# Patient Record
Sex: Female | Born: 1960 | Race: White | Hispanic: No | Marital: Married | Smoking: Never smoker
Health system: Southern US, Community
[De-identification: ages and names within clinical notes are randomized; demographics above are authoritative.]

## PROBLEM LIST (undated history)

## (undated) DIAGNOSIS — T7840XA Allergy, unspecified, initial encounter: Secondary | ICD-10-CM

## (undated) DIAGNOSIS — R87619 Unspecified abnormal cytological findings in specimens from cervix uteri: Secondary | ICD-10-CM

## (undated) HISTORY — DX: Allergy, unspecified, initial encounter: T78.40XA

## (undated) HISTORY — DX: Unspecified abnormal cytological findings in specimens from cervix uteri: R87.619

---

## 2000-04-20 ENCOUNTER — Ambulatory Visit (HOSPITAL_COMMUNITY): Admission: RE | Admit: 2000-04-20 | Discharge: 2000-04-20 | Payer: Self-pay | Admitting: *Deleted

## 2000-09-12 ENCOUNTER — Other Ambulatory Visit: Admission: RE | Admit: 2000-09-12 | Discharge: 2000-09-12 | Payer: Self-pay | Admitting: *Deleted

## 2000-09-14 ENCOUNTER — Other Ambulatory Visit: Admission: RE | Admit: 2000-09-14 | Discharge: 2000-09-14 | Payer: Self-pay | Admitting: *Deleted

## 2000-11-28 ENCOUNTER — Other Ambulatory Visit: Admission: RE | Admit: 2000-11-28 | Discharge: 2000-11-28 | Payer: Self-pay | Admitting: *Deleted

## 2001-04-03 ENCOUNTER — Other Ambulatory Visit: Admission: RE | Admit: 2001-04-03 | Discharge: 2001-04-03 | Payer: Self-pay | Admitting: *Deleted

## 2001-10-03 ENCOUNTER — Other Ambulatory Visit: Admission: RE | Admit: 2001-10-03 | Discharge: 2001-10-03 | Payer: Self-pay | Admitting: *Deleted

## 2002-10-08 ENCOUNTER — Other Ambulatory Visit: Admission: RE | Admit: 2002-10-08 | Discharge: 2002-10-08 | Payer: Self-pay | Admitting: *Deleted

## 2007-12-02 ENCOUNTER — Ambulatory Visit: Payer: Self-pay | Admitting: General Practice

## 2015-05-13 ENCOUNTER — Telehealth: Payer: Self-pay

## 2015-05-13 ENCOUNTER — Other Ambulatory Visit: Payer: Self-pay | Admitting: Family Medicine

## 2015-05-13 DIAGNOSIS — Z1231 Encounter for screening mammogram for malignant neoplasm of breast: Secondary | ICD-10-CM

## 2015-05-13 NOTE — Telephone Encounter (Signed)
We got letter from [place] stating she needed a mammo. Notified patient and order placed online for mammo thru [facility]. She will call and schedule her appointment.

## 2015-05-26 ENCOUNTER — Ambulatory Visit
Admission: RE | Admit: 2015-05-26 | Discharge: 2015-05-26 | Disposition: A | Discharge status: Home / Self Care | Payer: Managed Care, Other (non HMO) | Source: Ambulatory Visit | Attending: Family Medicine | Admitting: Family Medicine

## 2015-05-26 DIAGNOSIS — Z1231 Encounter for screening mammogram for malignant neoplasm of breast: Secondary | ICD-10-CM | POA: Diagnosis not present

## 2015-07-22 ENCOUNTER — Ambulatory Visit (INDEPENDENT_AMBULATORY_CARE_PROVIDER_SITE_OTHER): Payer: Managed Care, Other (non HMO) | Admitting: Family Medicine

## 2015-07-22 ENCOUNTER — Encounter: Payer: Self-pay | Admitting: Family Medicine

## 2015-07-22 VITALS — BP 128/78 | HR 79 | Temp 98.4°F | Resp 20 | Ht 65.0 in | Wt 178.2 lb

## 2015-07-22 DIAGNOSIS — Z1211 Encounter for screening for malignant neoplasm of colon: Secondary | ICD-10-CM | POA: Diagnosis not present

## 2015-07-22 DIAGNOSIS — Z124 Encounter for screening for malignant neoplasm of cervix: Secondary | ICD-10-CM | POA: Insufficient documentation

## 2015-07-22 DIAGNOSIS — D485 Neoplasm of uncertain behavior of skin: Secondary | ICD-10-CM | POA: Insufficient documentation

## 2015-07-22 DIAGNOSIS — Z0000 Encounter for general adult medical examination without abnormal findings: Secondary | ICD-10-CM | POA: Diagnosis not present

## 2015-07-22 NOTE — Assessment & Plan Note (Signed)
USPSTF grade A and B recommendations reviewed with patient; age-appropriate recommendations, preventive care, screening tests, etc discussed and encouraged; healthy living encouraged; see AVS for patient education given to patient; pt declined hiv and hep C testing

## 2015-07-22 NOTE — Patient Instructions (Addendum)
Please have labs done today We'll have you see a dermatologist to get that mole on your leg evaluated and removed You should receive information about the Cologuard soon If you have not heard anything from my staff in a week about any orders/referrals/studies from today, please contact us here to follow-up [phone]  Health Maintenance, Female Adopting a healthy lifestyle and getting preventive care can go a long way to promote health and wellness. Talk with your health care provider about what schedule of regular examinations is right for you. This is a good chance for you to check in with your provider about disease prevention and staying healthy. In between checkups, there are plenty of things you can do on your own. Experts have done a lot of research about which lifestyle changes and preventive measures are most likely to keep you healthy. Ask your health care provider for more information. WEIGHT AND DIET  Eat a healthy diet  Be sure to include plenty of vegetables, fruits, low-fat dairy products, and lean protein.  Do not eat a lot of foods high in solid fats, added sugars, or salt.  Get regular exercise. This is one of the most important things you can do for your health.  Most adults should exercise for at least 150 minutes each week. The exercise should increase your heart rate and make you sweat (moderate-intensity exercise).  Most adults should also do strengthening exercises at least twice a week. This is in addition to the moderate-intensity exercise.  Maintain a healthy weight  Body mass index (BMI) is a measurement that can be used to identify possible weight problems. It estimates body fat based on height and weight. Your health care provider can help determine your BMI and help you achieve or maintain a healthy weight.  For females 5 years of age and older:   A BMI below 18.5 is considered underweight.  A BMI of 18.5 to 24.9 is normal.  A BMI of 25 to 29.9 is  considered overweight.  A BMI of 30 and above is considered obese.  Watch levels of cholesterol and blood lipids  You should start having your blood tested for lipids and cholesterol at 55 years of age, then have this test every 5 years.  You may need to have your cholesterol levels checked more often if:  Your lipid or cholesterol levels are high.  You are older than 55 years of age.  You are at high risk for heart disease.  CANCER SCREENING   Lung Cancer  Lung cancer screening is recommended for adults 51-74 years old who are at high risk for lung cancer because of a history of smoking.  A yearly low-dose CT scan of the lungs is recommended for people who:  Currently smoke.  Have quit within the past 15 years.  Have at least a 30-pack-year history of smoking. A pack year is smoking an average of one pack of cigarettes a day for 1 year.  Yearly screening should continue until it has been 15 years since you quit.  Yearly screening should stop if you develop a health problem that would prevent you from having lung cancer treatment.  Breast Cancer  Practice breast self-awareness. This means understanding how your breasts normally appear and feel.  It also means doing regular breast self-exams. Let your health care provider know about any changes, no matter how small.  If you are in your 20s or 30s, you should have a clinical breast exam (CBE) by a health  care provider every 1-3 years as part of a regular health exam.  If you are 68 or older, have a CBE every year. Also consider having a breast X-ray (mammogram) every year.  If you have a family history of breast cancer, talk to your health care provider about genetic screening.  If you are at high risk for breast cancer, talk to your health care provider about having an MRI and a mammogram every year.  Breast cancer gene (BRCA) assessment is recommended for women who have family members with BRCA-related cancers.  BRCA-related cancers include:  Breast.  Ovarian.  Tubal.  Peritoneal cancers.  Results of the assessment will determine the need for genetic counseling and BRCA1 and BRCA2 testing. Cervical Cancer Your health care provider may recommend that you be screened regularly for cancer of the pelvic organs (ovaries, uterus, and vagina). This screening involves a pelvic examination, including checking for microscopic changes to the surface of your cervix (Pap test). You may be encouraged to have this screening done every 3 years, beginning at age 80.  For women ages 49-65, health care providers may recommend pelvic exams and Pap testing every 3 years, or they may recommend the Pap and pelvic exam, combined with testing for human papilloma virus (HPV), every 5 years. Some types of HPV increase your risk of cervical cancer. Testing for HPV may also be done on women of any age with unclear Pap test results.  Other health care providers may not recommend any screening for nonpregnant women who are considered low risk for pelvic cancer and who do not have symptoms. Ask your health care provider if a screening pelvic exam is right for you.  If you have had past treatment for cervical cancer or a condition that could lead to cancer, you need Pap tests and screening for cancer for at least 20 years after your treatment. If Pap tests have been discontinued, your risk factors (such as having a new sexual partner) need to be reassessed to determine if screening should resume. Some women have medical problems that increase the chance of getting cervical cancer. In these cases, your health care provider may recommend more frequent screening and Pap tests. Colorectal Cancer  This type of cancer can be detected and often prevented.  Routine colorectal cancer screening usually begins at 55 years of age and continues through 55 years of age.  Your health care provider may recommend screening at an earlier age if you  have risk factors for colon cancer.  Your health care provider may also recommend using home test kits to check for hidden blood in the stool.  A small camera at the end of a tube can be used to examine your colon directly (sigmoidoscopy or colonoscopy). This is done to check for the earliest forms of colorectal cancer.  Routine screening usually begins at age 11.  Direct examination of the colon should be repeated every 5-10 years through 55 years of age. However, you may need to be screened more often if early forms of precancerous polyps or small growths are found. Skin Cancer  Check your skin from head to toe regularly.  Tell your health care provider about any new moles or changes in moles, especially if there is a change in a mole's shape or color.  Also tell your health care provider if you have a mole that is larger than the size of a pencil eraser.  Always use sunscreen. Apply sunscreen liberally and repeatedly throughout the day.  Protect  yourself by wearing long sleeves, pants, a wide-brimmed hat, and sunglasses whenever you are outside. HEART DISEASE, DIABETES, AND HIGH BLOOD PRESSURE   High blood pressure causes heart disease and increases the risk of stroke. High blood pressure is more likely to develop in:  People who have blood pressure in the high end of the normal range (130-139/85-89 mm Hg).  People who are overweight or obese.  People who are African American.  If you are 54-24 years of age, have your blood pressure checked every 3-5 years. If you are 27 years of age or older, have your blood pressure checked every year. You should have your blood pressure measured twice--once when you are at a hospital or clinic, and once when you are not at a hospital or clinic. Record the average of the two measurements. To check your blood pressure when you are not at a hospital or clinic, you can use:  An automated blood pressure machine at a pharmacy.  A home blood pressure  monitor.  If you are between 57 years and 2 years old, ask your health care provider if you should take aspirin to prevent strokes.  Have regular diabetes screenings. This involves taking a blood sample to check your fasting blood sugar level.  If you are at a normal weight and have a low risk for diabetes, have this test once every three years after 55 years of age.  If you are overweight and have a high risk for diabetes, consider being tested at a younger age or more often. PREVENTING INFECTION  Hepatitis B  If you have a higher risk for hepatitis B, you should be screened for this virus. You are considered at high risk for hepatitis B if:  You were born in a country where hepatitis B is common. Ask your health care provider which countries are considered high risk.  Your parents were born in a high-risk country, and you have not been immunized against hepatitis B (hepatitis B vaccine).  You have HIV or AIDS.  You use needles to inject street drugs.  You live with someone who has hepatitis B.  You have had sex with someone who has hepatitis B.  You get hemodialysis treatment.  You take certain medicines for conditions, including cancer, organ transplantation, and autoimmune conditions. Hepatitis C  Blood testing is recommended for:  Everyone born from [birth year] through 1965.  Anyone with known risk factors for hepatitis C. Sexually transmitted infections (STIs)  You should be screened for sexually transmitted infections (STIs) including gonorrhea and chlamydia if:  You are sexually active and are younger than 55 years of age.  You are older than 55 years of age and your health care provider tells you that you are at risk for this type of infection.  Your sexual activity has changed since you were last screened and you are at an increased risk for chlamydia or gonorrhea. Ask your health care provider if you are at risk.  If you do not have HIV, but are at risk, it may be  recommended that you take a prescription medicine daily to prevent HIV infection. This is called pre-exposure prophylaxis (PrEP). You are considered at risk if:  You are sexually active and do not regularly use condoms or know the HIV status of your partner(s).  You take drugs by injection.  You are sexually active with a partner who has HIV. Talk with your health care provider about whether you are at high risk of being infected with  HIV. If you choose to begin PrEP, you should first be tested for HIV. You should then be tested every 3 months for as long as you are taking PrEP.  PREGNANCY   If you are premenopausal and you may become pregnant, ask your health care provider about preconception counseling.  If you may become pregnant, take 400 to 800 micrograms (mcg) of folic acid every day.  If you want to prevent pregnancy, talk to your health care provider about birth control (contraception). OSTEOPOROSIS AND MENOPAUSE   Osteoporosis is a disease in which the bones lose minerals and strength with aging. This can result in serious bone fractures. Your risk for osteoporosis can be identified using a bone density scan.  If you are 72 years of age or older, or if you are at risk for osteoporosis and fractures, ask your health care provider if you should be screened.  Ask your health care provider whether you should take a calcium or vitamin D supplement to lower your risk for osteoporosis.  Menopause may have certain physical symptoms and risks.  Hormone replacement therapy may reduce some of these symptoms and risks. Talk to your health care provider about whether hormone replacement therapy is right for you.  HOME CARE INSTRUCTIONS   Schedule regular health, dental, and eye exams.  Stay current with your immunizations.   Do not use any tobacco products including cigarettes, chewing tobacco, or electronic cigarettes.  If you are pregnant, do not drink alcohol.  If you are  breastfeeding, limit how much and how often you drink alcohol.  Limit alcohol intake to no more than 1 drink per day for nonpregnant women. One drink equals 12 ounces of beer, 5 ounces of wine, or 1 ounces of hard liquor.  Do not use street drugs.  Do not share needles.  Ask your health care provider for help if you need support or information about quitting drugs.  Tell your health care provider if you often feel depressed.  Tell your health care provider if you have ever been abused or do not feel safe at home.   This information is not intended to replace advice given to you by your health care provider. Make sure you discuss any questions you have with your health care provider.   Document Released: 09/05/2010 Document Revised: 03/13/2014 Document Reviewed: 01/22/2013 Elsevier Interactive Patient Education [employer].

## 2015-07-22 NOTE — Assessment & Plan Note (Signed)
She declined olonoscopy but agrees to cologaurd

## 2015-07-22 NOTE — Progress Notes (Signed)
Patient ID: Katelyn Ochoa, female   DOB: October 22, 1960, 55 y.o.   MRN: [id number]   Subjective:   Katelyn Ochoa is a 55 y.o. female here for a complete physical exam  Interim issues since last visit: dealing with daughter's breast cancer  USPSTF grade A and B recommendations Alcohol: no Depression:  Depression screen [facility] 2/9 07/22/2015  Decreased Interest 0  Down, Depressed, Hopeless 0  PHQ - 2 Score 0   Hypertension: no Obesity: no Tobacco use: no  HIV, hep C: politely declined STD testing and prevention (chl/gon/syphilis): politely declined Lipids: check today (FASTING) Glucose: check today (FASTING) Colorectal cancer: denies colonoscopy but willing to get Cologuard Breast cancer: daughter, BRCA neg BRCA gene screening: no ovarian Intimate partner violence: no Cervical cancer screening: today; hx of abnormal, hx of LEEP, all normal since 3+ Vitamin D: takes multivitamins Aspirin: daily 81 mg EC Diet: little better than typical American Exercise: treadmill Skin cancer: dark mole on left shin, just popped up   Past Medical History  Diagnosis Date  . Abnormal Pap smear of cervix maybe 2002-2007    hx of LEEP     History reviewed. No pertinent past surgical history.    Family History  Problem Relation Age of Onset  . Breast cancer Daughter 50  . Cancer Daughter     breast  . Breast cancer Maternal Aunt   . Colon cancer Neg Hx   . Ovarian cancer Neg Hx   . Hyperlipidemia Mother   . Heart disease Father   . COPD Father    Social History  Substance Use Topics  . Smoking status: Never Smoker   . Smokeless tobacco: Not on file  . Alcohol Use: No   Review of Systems  Constitutional: Negative for unexpected weight change.  HENT: Negative for hearing loss.   Eyes: Negative for visual disturbance.  Respiratory: Negative for shortness of breath.   Cardiovascular: Negative for chest pain.  Gastrointestinal: Negative for abdominal pain and blood in stool.   Genitourinary: Negative for hematuria.  Musculoskeletal: Positive for arthralgias (little bit of enlargement right 1st MTP).  Skin:       Dark mole left shin  Allergic/Immunologic: Negative for food allergies.  Neurological: Negative for tremors.  Hematological: Does not bruise/bleed easily.   Objective:   Filed Vitals:   07/22/15 0845  BP: 128/78  Pulse: 79  Temp: 98.4 F (36.9 C)  Resp: 20  Height: 5' 5"  (1.651 m)  Weight: 178 lb 3 oz (80.825 kg)  SpO2: 97%   Body mass index is 29.65 kg/(m^2). Wt Readings from Last 3 Encounters:  07/22/15 178 lb 3 oz (80.825 kg)   Physical Exam  Constitutional: She appears well-developed and well-nourished.  HENT:  Head: Normocephalic and atraumatic.  Eyes: Conjunctivae and EOM are normal. Right eye exhibits no hordeolum. Left eye exhibits no hordeolum. No scleral icterus.  Neck: Carotid bruit is not present. No thyromegaly present.  Cardiovascular: Normal rate, regular rhythm, S1 normal, S2 normal and normal heart sounds.   No extrasystoles are present.  Pulmonary/Chest: Effort normal and breath sounds normal. No respiratory distress. Right breast exhibits no inverted nipple, no mass, no nipple discharge, no skin change and no tenderness. Left breast exhibits no inverted nipple, no mass, no nipple discharge, no skin change and no tenderness. Breasts are symmetrical.  Abdominal: Soft. Normal appearance and bowel sounds are normal. She exhibits no distension, no abdominal bruit, no pulsatile midline mass and no mass. There is no hepatosplenomegaly.  There is no tenderness. No hernia.  Genitourinary: Uterus normal. Pelvic exam was performed with patient prone. There is no rash or lesion on the right labia. There is no rash or lesion on the left labia. Cervix exhibits no motion tenderness. Right adnexum displays no mass, no tenderness and no fullness. Left adnexum displays no mass, no tenderness and no fullness.  Musculoskeletal: Normal range of  motion. She exhibits no edema.  Lymphadenopathy:       Head (right side): No submandibular adenopathy present.       Head (left side): No submandibular adenopathy present.    She has no cervical adenopathy.    She has no axillary adenopathy.  Neurological: She is alert. She displays no tremor. No cranial nerve deficit. She exhibits normal muscle tone. Gait normal.  Skin: Skin is warm and dry. Lesion (2-3 mm very dark brown to blackish anteromedial LEFT shin) noted. No bruising and no ecchymosis noted. No cyanosis. No pallor.  Psychiatric: Her speech is normal and behavior is normal. Thought content normal. Her mood appears not anxious. She does not exhibit a depressed mood.    Assessment/Plan:   Problem List Items Addressed This Visit      Musculoskeletal and Integument   Neoplasm of uncertain behavior of skin of lower leg    Very dark, worrisome; refer to derm      Relevant Orders   Ambulatory referral to Dermatology     Other   Colon cancer screening    She declined olonoscopy but agrees to cologaurd      Relevant Orders   Cologuard   Pap smear for cervical cancer screening    Remote abnormal; thin prep collected today      Relevant Orders   IGP, rfx Aptima HPV ASCU   Preventative health care - Primary    USPSTF grade A and B recommendations reviewed with patient; age-appropriate recommendations, preventive care, screening tests, etc discussed and encouraged; healthy living encouraged; see AVS for patient education given to patient; pt declined hiv and hep C testing      Relevant Orders   CBC with Differential/Platelet   Comprehensive metabolic panel   Lipid Panel w/o Chol/HDL Ratio   TSH      Orders Placed This Encounter  Procedures  . Cologuard  . CBC with Differential/Platelet  . Comprehensive metabolic panel    Order Specific Question:  Has the patient fasted?    Answer:  Yes  . Lipid Panel w/o Chol/HDL Ratio    Order Specific Question:  Has the patient  fasted?    Answer:  Yes  . TSH  . Ambulatory referral to Dermatology    Referral Priority:  Medium    Referral Type:  Consultation    Referral Reason:  Specialty Services Required    Requested Specialty:  Dermatology    Number of Visits Requested:  1   Follow up plan: Return in about 1 year (around 07/21/2016) for complete physical.  An After Visit Summary was printed and given to the patient.

## 2015-07-22 NOTE — Assessment & Plan Note (Signed)
Very dark, worrisome; refer to derm

## 2015-07-22 NOTE — Assessment & Plan Note (Signed)
Remote abnormal; thin prep collected today

## 2015-07-23 LAB — CBC WITH DIFFERENTIAL/PLATELET
Basophils Absolute: 0 10*3/uL (ref 0.0–0.2)
Basos: 1 %
EOS (ABSOLUTE): 0.2 10*3/uL (ref 0.0–0.4)
EOS: 4 %
HEMATOCRIT: 46.6 % (ref 34.0–46.6)
HEMOGLOBIN: 15.1 g/dL (ref 11.1–15.9)
Immature Grans (Abs): 0 10*3/uL (ref 0.0–0.1)
Immature Granulocytes: 0 %
LYMPHS ABS: 1.7 10*3/uL (ref 0.7–3.1)
Lymphs: 31 %
MCH: 30 pg (ref 26.6–33.0)
MCHC: 32.4 g/dL (ref 31.5–35.7)
MCV: 93 fL (ref 79–97)
MONOCYTES: 7 %
Monocytes Absolute: 0.4 10*3/uL (ref 0.1–0.9)
Neutrophils Absolute: 3.1 10*3/uL (ref 1.4–7.0)
Neutrophils: 57 %
Platelets: 149 10*3/uL — ABNORMAL LOW (ref 150–379)
RBC: 5.04 x10E6/uL (ref 3.77–5.28)
RDW: 14 % (ref 12.3–15.4)
WBC: 5.4 10*3/uL (ref 3.4–10.8)

## 2015-07-23 LAB — COMPREHENSIVE METABOLIC PANEL
ALBUMIN: 4.6 g/dL (ref 3.5–5.5)
ALK PHOS: 95 IU/L (ref 39–117)
ALT: 17 IU/L (ref 0–32)
AST: 20 IU/L (ref 0–40)
Albumin/Globulin Ratio: 1.9 (ref 1.2–2.2)
BUN/Creatinine Ratio: 15 (ref 9–23)
BUN: 13 mg/dL (ref 6–24)
Bilirubin Total: 0.5 mg/dL (ref 0.0–1.2)
CALCIUM: 10.4 mg/dL — AB (ref 8.7–10.2)
CHLORIDE: 103 mmol/L (ref 96–106)
CO2: 27 mmol/L (ref 18–29)
Creatinine, Ser: 0.85 mg/dL (ref 0.57–1.00)
GFR calc Af Amer: 89 mL/min/{1.73_m2} (ref >59)
GFR calc non Af Amer: 77 mL/min/{1.73_m2} (ref >59)
GLOBULIN, TOTAL: 2.4 g/dL (ref 1.5–4.5)
Glucose: 96 mg/dL (ref 65–99)
POTASSIUM: 5.1 mmol/L (ref 3.5–5.2)
Sodium: 147 mmol/L — ABNORMAL HIGH (ref 134–144)
TOTAL PROTEIN: 7 g/dL (ref 6.0–8.5)

## 2015-07-23 LAB — LIPID PANEL W/O CHOL/HDL RATIO
CHOLESTEROL TOTAL: 217 mg/dL — AB (ref 100–199)
HDL: 77 mg/dL (ref >39)
LDL Calculated: 123 mg/dL — ABNORMAL HIGH (ref 0–99)
TRIGLYCERIDES: 86 mg/dL (ref 0–149)
VLDL CHOLESTEROL CAL: 17 mg/dL (ref 5–40)

## 2015-07-23 LAB — TSH: TSH: 2.71 u[IU]/mL (ref 0.450–4.500)

## 2015-07-24 ENCOUNTER — Other Ambulatory Visit: Payer: Self-pay | Admitting: Family Medicine

## 2015-07-24 DIAGNOSIS — E870 Hyperosmolality and hypernatremia: Secondary | ICD-10-CM

## 2015-07-24 LAB — IGP, RFX APTIMA HPV ASCU: PAP SMEAR COMMENT: 0

## 2015-07-27 ENCOUNTER — Other Ambulatory Visit: Payer: Self-pay

## 2015-07-27 DIAGNOSIS — E870 Hyperosmolality and hypernatremia: Secondary | ICD-10-CM

## 2015-07-28 LAB — SPECIMEN STATUS REPORT

## 2015-07-28 LAB — HPV, 16/18,45: HPV, high-risk: NEGATIVE

## 2015-07-30 LAB — BASIC METABOLIC PANEL
BUN / CREAT RATIO: 20 (ref 9–23)
BUN: 15 mg/dL (ref 6–24)
CALCIUM: 9.6 mg/dL (ref 8.7–10.2)
CO2: 23 mmol/L (ref 18–29)
Chloride: 100 mmol/L (ref 96–106)
Creatinine, Ser: 0.74 mg/dL (ref 0.57–1.00)
GFR, EST AFRICAN AMERICAN: 105 mL/min/{1.73_m2} (ref >59)
GFR, EST NON AFRICAN AMERICAN: 91 mL/min/{1.73_m2} (ref >59)
Glucose: 93 mg/dL (ref 65–99)
Potassium: 4.4 mmol/L (ref 3.5–5.2)
Sodium: 142 mmol/L (ref 134–144)

## 2016-04-27 ENCOUNTER — Other Ambulatory Visit: Payer: Self-pay | Admitting: Family Medicine

## 2016-04-27 DIAGNOSIS — Z1231 Encounter for screening mammogram for malignant neoplasm of breast: Secondary | ICD-10-CM

## 2016-05-30 ENCOUNTER — Ambulatory Visit
Admission: RE | Admit: 2016-05-30 | Discharge: 2016-05-30 | Disposition: A | Discharge status: Home / Self Care | Payer: Managed Care, Other (non HMO) | Source: Ambulatory Visit | Attending: Family Medicine | Admitting: Family Medicine

## 2016-05-30 DIAGNOSIS — Z1231 Encounter for screening mammogram for malignant neoplasm of breast: Secondary | ICD-10-CM | POA: Insufficient documentation

## 2016-07-25 ENCOUNTER — Encounter: Payer: Self-pay | Admitting: Family Medicine

## 2016-07-25 ENCOUNTER — Ambulatory Visit (INDEPENDENT_AMBULATORY_CARE_PROVIDER_SITE_OTHER): Payer: Managed Care, Other (non HMO) | Admitting: Family Medicine

## 2016-07-25 VITALS — BP 130/72 | HR 84 | Temp 97.9°F | Resp 14 | Ht 64.5 in | Wt 175.8 lb

## 2016-07-25 DIAGNOSIS — Z0000 Encounter for general adult medical examination without abnormal findings: Secondary | ICD-10-CM | POA: Diagnosis not present

## 2016-07-25 DIAGNOSIS — Z1211 Encounter for screening for malignant neoplasm of colon: Secondary | ICD-10-CM | POA: Diagnosis not present

## 2016-07-25 DIAGNOSIS — Z23 Encounter for immunization: Secondary | ICD-10-CM | POA: Diagnosis not present

## 2016-07-25 LAB — CBC WITH DIFFERENTIAL/PLATELET
BASOS ABS: 52 {cells}/uL (ref 0–200)
BASOS PCT: 1 %
Eosinophils Absolute: 260 cells/uL (ref 15–500)
Eosinophils Relative: 5 %
HEMATOCRIT: 47.6 % — AB (ref 35.0–45.0)
Hemoglobin: 15.6 g/dL — ABNORMAL HIGH (ref 11.7–15.5)
Lymphocytes Relative: 31 %
Lymphs Abs: 1612 cells/uL (ref 850–3900)
MCH: 30 pg (ref 27.0–33.0)
MCHC: 32.8 g/dL (ref 32.0–36.0)
MCV: 91.5 fL (ref 80.0–100.0)
MONO ABS: 312 {cells}/uL (ref 200–950)
Monocytes Relative: 6 %
NEUTROS PCT: 57 %
Neutro Abs: 2964 cells/uL (ref 1500–7800)
Platelets: 138 10*3/uL — ABNORMAL LOW (ref 140–400)
RBC: 5.2 MIL/uL — ABNORMAL HIGH (ref 3.80–5.10)
RDW: 13.9 % (ref 11.0–15.0)
WBC: 5.2 10*3/uL (ref 3.8–10.8)

## 2016-07-25 LAB — LIPID PANEL
Cholesterol: 226 mg/dL — ABNORMAL HIGH (ref <200)
HDL: 79 mg/dL (ref >50)
LDL CALC: 130 mg/dL — AB (ref <100)
TRIGLYCERIDES: 83 mg/dL (ref <150)
Total CHOL/HDL Ratio: 2.9 Ratio (ref <5.0)
VLDL: 17 mg/dL (ref <30)

## 2016-07-25 LAB — COMPLETE METABOLIC PANEL WITH GFR
ALBUMIN: 4.2 g/dL (ref 3.6–5.1)
ALK PHOS: 86 U/L (ref 33–130)
ALT: 15 U/L (ref 6–29)
AST: 16 U/L (ref 10–35)
BUN: 17 mg/dL (ref 7–25)
CO2: 23 mmol/L (ref 20–31)
Calcium: 9.8 mg/dL (ref 8.6–10.4)
Chloride: 105 mmol/L (ref 98–110)
Creat: 0.78 mg/dL (ref 0.50–1.05)
GFR, Est African American: >89 mL/min (ref >=60)
GFR, Est Non African American: 85 mL/min (ref >=60)
GLUCOSE: 91 mg/dL (ref 65–99)
POTASSIUM: 4.5 mmol/L (ref 3.5–5.3)
SODIUM: 141 mmol/L (ref 135–146)
Total Bilirubin: 0.5 mg/dL (ref 0.2–1.2)
Total Protein: 6.6 g/dL (ref 6.1–8.1)

## 2016-07-25 NOTE — Progress Notes (Signed)
Patient ID: Katelyn Ochoa, female   DOB: 1960/12/05, 56 y.o.   MRN: [id number]   Subjective:   Katelyn Ochoa is a 56 y.o. female here for a complete physical exam  Interim issues since last visit: nothing exciting  USPSTF grade A and B recommendations Depression:  Depression screen [facility] 2/9 07/25/2016 07/22/2015  Decreased Interest 0 0  Down, Depressed, Hopeless 1 0  PHQ - 2 Score 1 0   Hypertension: BP Readings from Last 3 Encounters:  07/25/16 130/72  07/22/15 128/78   Obesity: Wt Readings from Last 3 Encounters:  07/25/16 175 lb 12.8 oz (79.7 kg)  07/22/15 178 lb 3 oz (80.8 kg)   BMI Readings from Last 3 Encounters:  07/25/16 29.71 kg/m  07/22/15 29.65 kg/m    Alcohol: less than 7 Tobacco use: nonsmoker HIV, hep B, hep C: not interested STD testing and prevention (chl/gon/syphilis): not interested Intimate partner violence: no abuse Breast cancer: no lumps; breast exam done in march  BRCA gene screening: Judson 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, hormone receptor positive, no ovarian cancer Cervical cancer screening: 2020 Osteoporosis: no steroids as a child Fall prevention/vitamin D: discussed, taking multivitamin Lipids:  Lab Results  Component Value Date   CHOL 217 (H) 07/22/2015   Lab Results  Component Value Date   HDL 77 07/22/2015   Lab Results  Component Value Date   LDLCALC 123 (H) 07/22/2015   Lab Results  Component Value Date   TRIG 86 07/22/2015   No results found for: CHOLHDL No results found for: LDLDIRECT Glucose:  Glucose  Date Value Ref Range Status  07/29/2015 93 65 - 99 mg/dL Final  07/22/2015 96 65 - 99 mg/dL Final   Colorectal cancer: no screening done yet; willing to do cologuard Lung cancer:  n/a AAA: n/a Aspirin: baby aspirin Diet: discussed calcium intake; almond milk; broccoli; fiber, will try to increase fruits, veggies Exercise: probably not, does walk Skin cancer: nothing worrisome; dermatologist removed lesion on left lower leg; no  cancer Fell during snow storm and scratched her leg up   Past Medical History:  Diagnosis Date  . Abnormal Pap smear of cervix maybe 2002-2007   hx of LEEP   No past surgical history on file. Family History  Problem Relation Age of Onset  . Hyperlipidemia Mother   . Heart disease Father   . COPD Father   . Breast cancer Daughter 29  . Cancer Daughter        breast  . Breast cancer Maternal Aunt   . Colon cancer Neg Hx   . Ovarian cancer Neg Hx    Social History  Substance Use Topics  . Smoking status: Never Smoker  . Smokeless tobacco: Never Used  . Alcohol use No   Review of Systems  Objective:   Vitals:   07/25/16 0833  BP: 130/72  Pulse: 84  Resp: 14  Temp: 97.9 F (36.6 C)  TempSrc: Oral  SpO2: 96%  Weight: 175 lb 12.8 oz (79.7 kg)  Height: 5' 4.5" (1.638 m)   Body mass index is 29.71 kg/m. Wt Readings from Last 3 Encounters:  07/25/16 175 lb 12.8 oz (79.7 kg)  07/22/15 178 lb 3 oz (80.8 kg)   Physical Exam  Constitutional: She appears well-developed and well-nourished.  HENT:  Head: Normocephalic and atraumatic.  Right Ear: Hearing, tympanic membrane, external ear and ear canal normal.  Left Ear: Hearing, tympanic membrane, external ear and ear canal normal.  Eyes: Conjunctivae and EOM are normal.  Right eye exhibits no hordeolum. Left eye exhibits no hordeolum. No scleral icterus.  Neck: Carotid bruit is not present. No thyromegaly present.  Cardiovascular: Normal rate, regular rhythm, S1 normal, S2 normal and normal heart sounds.   No extrasystoles are present.  Pulmonary/Chest: Effort normal and breath sounds normal. No respiratory distress. Right breast exhibits no inverted nipple, no mass, no nipple discharge, no skin change and no tenderness. Left breast exhibits no inverted nipple, no mass, no nipple discharge, no skin change and no tenderness. Breasts are symmetrical.  Abdominal: Soft. Normal appearance and bowel sounds are normal. She exhibits  no distension, no abdominal bruit, no pulsatile midline mass and no mass. There is no hepatosplenomegaly. There is no tenderness. No hernia.  Musculoskeletal: Normal range of motion. She exhibits no edema.  Lymphadenopathy:       Head (right side): No submandibular adenopathy present.       Head (left side): No submandibular adenopathy present.    She has no cervical adenopathy.    She has no axillary adenopathy.  Neurological: She is alert. She displays no tremor. No cranial nerve deficit. She exhibits normal muscle tone. Gait normal.  Reflex Scores:      Patellar reflexes are 2+ on the right side and 2+ on the left side. Skin: Skin is warm and dry. No bruising and no ecchymosis noted. No cyanosis. No pallor.  Psychiatric: Her speech is normal and behavior is normal. Thought content normal. Her mood appears not anxious. She does not exhibit a depressed mood.   Assessment/Plan:   Problem List Items Addressed This Visit      Other   Preventative health care - Primary    USPSTF grade A and B recommendations reviewed with patient; age-appropriate recommendations, preventive care, screening tests, etc discussed and encouraged; healthy living encouraged; see AVS for patient education given to patient       Relevant Orders   COMPLETE METABOLIC PANEL WITH GFR   CBC with Differential/Platelet   Lipid panel   TSH    Other Visit Diagnoses    Screen for colon cancer       Relevant Orders   Cologuard   Need for Tdap vaccination           Meds ordered this encounter  Medications  . Multiple Vitamin (MULTIVITAMIN) tablet    Sig: Take 1 tablet by mouth daily.  Marland Kitchen aspirin EC 81 MG tablet    Sig: Take 81 mg by mouth daily.  . Vit B6-Vit B12-Omega 3 Acids (VITAMIN B PLUS+ PO)    Sig: Take by mouth.   Orders Placed This Encounter  Procedures  . COMPLETE METABOLIC PANEL WITH GFR  . CBC with Differential/Platelet  . Lipid panel  . TSH  . Cologuard    Screen for colon cancer     Follow up plan: Return in about 1 year (around 07/25/2017) for complete physical.  An After Visit Summary was printed and given to the patient.

## 2016-07-25 NOTE — Assessment & Plan Note (Signed)
USPSTF grade A and B recommendations reviewed with patient; age-appropriate recommendations, preventive care, screening tests, etc discussed and encouraged; healthy living encouraged; see AVS for patient education given to patient  

## 2016-07-25 NOTE — Patient Instructions (Addendum)
Try for 150 minutes of physical activity every week Health Maintenance, Female Adopting a healthy lifestyle and getting preventive care can go a long way to promote health and wellness. Talk with your health care provider about what schedule of regular examinations is right for you. This is a good chance for you to check in with your provider about disease prevention and staying healthy. In between checkups, there are plenty of things you can do on your own. Experts have done a lot of research about which lifestyle changes and preventive measures are most likely to keep you healthy. Ask your health care provider for more information. Weight and diet Eat a healthy diet  Be sure to include plenty of vegetables, fruits, low-fat dairy products, and lean protein.  Do not eat a lot of foods high in solid fats, added sugars, or salt.  Get regular exercise. This is one of the most important things you can do for your health.  Most adults should exercise for at least 150 minutes each week. The exercise should increase your heart rate and make you sweat (moderate-intensity exercise).  Most adults should also do strengthening exercises at least twice a week. This is in addition to the moderate-intensity exercise. Maintain a healthy weight  Body mass index (BMI) is a measurement that can be used to identify possible weight problems. It estimates body fat based on height and weight. Your health care provider can help determine your BMI and help you achieve or maintain a healthy weight.  For females 35 years of age and older:  A BMI below 18.5 is considered underweight.  A BMI of 18.5 to 24.9 is normal.  A BMI of 25 to 29.9 is considered overweight.  A BMI of 30 and above is considered obese. Watch levels of cholesterol and blood lipids  You should start having your blood tested for lipids and cholesterol at 56 years of age, then have this test every 5 years.  You may need to have your cholesterol  levels checked more often if:  Your lipid or cholesterol levels are high.  You are older than 56 years of age.  You are at high risk for heart disease. Cancer screening Lung Cancer  Lung cancer screening is recommended for adults 98-27 years old who are at high risk for lung cancer because of a history of smoking.  A yearly low-dose CT scan of the lungs is recommended for people who:  Currently smoke.  Have quit within the past 15 years.  Have at least a 30-pack-year history of smoking. A pack year is smoking an average of one pack of cigarettes a day for 1 year.  Yearly screening should continue until it has been 15 years since you quit.  Yearly screening should stop if you develop a health problem that would prevent you from having lung cancer treatment. Breast Cancer  Practice breast self-awareness. This means understanding how your breasts normally appear and feel.  It also means doing regular breast self-exams. Let your health care provider know about any changes, no matter how small.  If you are in your 20s or 30s, you should have a clinical breast exam (CBE) by a health care provider every 1-3 years as part of a regular health exam.  If you are 14 or older, have a CBE every year. Also consider having a breast X-ray (mammogram) every year.  If you have a family history of breast cancer, talk to your health care provider about genetic screening.  If  you are at high risk for breast cancer, talk to your health care provider about having an MRI and a mammogram every year.  Breast cancer gene (BRCA) assessment is recommended for women who have family members with BRCA-related cancers. BRCA-related cancers include:  Breast.  Ovarian.  Tubal.  Peritoneal cancers.  Results of the assessment will determine the need for genetic counseling and BRCA1 and BRCA2 testing. Cervical Cancer  Your health care provider may recommend that you be screened regularly for cancer of the  pelvic organs (ovaries, uterus, and vagina). This screening involves a pelvic examination, including checking for microscopic changes to the surface of your cervix (Pap test). You may be encouraged to have this screening done every 3 years, beginning at age 23.  For women ages 13-65, health care providers may recommend pelvic exams and Pap testing every 3 years, or they may recommend the Pap and pelvic exam, combined with testing for human papilloma virus (HPV), every 5 years. Some types of HPV increase your risk of cervical cancer. Testing for HPV may also be done on women of any age with unclear Pap test results.  Other health care providers may not recommend any screening for nonpregnant women who are considered low risk for pelvic cancer and who do not have symptoms. Ask your health care provider if a screening pelvic exam is right for you.  If you have had past treatment for cervical cancer or a condition that could lead to cancer, you need Pap tests and screening for cancer for at least 20 years after your treatment. If Pap tests have been discontinued, your risk factors (such as having a new sexual partner) need to be reassessed to determine if screening should resume. Some women have medical problems that increase the chance of getting cervical cancer. In these cases, your health care provider may recommend more frequent screening and Pap tests. Colorectal Cancer  This type of cancer can be detected and often prevented.  Routine colorectal cancer screening usually begins at 56 years of age and continues through 56 years of age.  Your health care provider may recommend screening at an earlier age if you have risk factors for colon cancer.  Your health care provider may also recommend using home test kits to check for hidden blood in the stool.  A small camera at the end of a tube can be used to examine your colon directly (sigmoidoscopy or colonoscopy). This is done to check for the earliest  forms of colorectal cancer.  Routine screening usually begins at age 74.  Direct examination of the colon should be repeated every 5-10 years through 56 years of age. However, you may need to be screened more often if early forms of precancerous polyps or small growths are found. Skin Cancer  Check your skin from head to toe regularly.  Tell your health care provider about any new moles or changes in moles, especially if there is a change in a mole's shape or color.  Also tell your health care provider if you have a mole that is larger than the size of a pencil eraser.  Always use sunscreen. Apply sunscreen liberally and repeatedly throughout the day.  Protect yourself by wearing long sleeves, pants, a wide-brimmed hat, and sunglasses whenever you are outside. Heart disease, diabetes, and high blood pressure  High blood pressure causes heart disease and increases the risk of stroke. High blood pressure is more likely to develop in:  People who have blood pressure in the high  end of the normal range (130-139/85-89 mm Hg).  People who are overweight or obese.  People who are African American.  If you are 46-81 years of age, have your blood pressure checked every 3-5 years. If you are 35 years of age or older, have your blood pressure checked every year. You should have your blood pressure measured twice-once when you are at a hospital or clinic, and once when you are not at a hospital or clinic. Record the average of the two measurements. To check your blood pressure when you are not at a hospital or clinic, you can use:  An automated blood pressure machine at a pharmacy.  A home blood pressure monitor.  If you are between 33 years and 58 years old, ask your health care provider if you should take aspirin to prevent strokes.  Have regular diabetes screenings. This involves taking a blood sample to check your fasting blood sugar level.  If you are at a normal weight and have a low  risk for diabetes, have this test once every three years after 56 years of age.  If you are overweight and have a high risk for diabetes, consider being tested at a younger age or more often. Preventing infection Hepatitis B  If you have a higher risk for hepatitis B, you should be screened for this virus. You are considered at high risk for hepatitis B if:  You were born in a country where hepatitis B is common. Ask your health care provider which countries are considered high risk.  Your parents were born in a high-risk country, and you have not been immunized against hepatitis B (hepatitis B vaccine).  You have HIV or AIDS.  You use needles to inject street drugs.  You live with someone who has hepatitis B.  You have had sex with someone who has hepatitis B.  You get hemodialysis treatment.  You take certain medicines for conditions, including cancer, organ transplantation, and autoimmune conditions. Hepatitis C  Blood testing is recommended for:  Everyone born from [birth year] through 1965.  Anyone with known risk factors for hepatitis C. Sexually transmitted infections (STIs)  You should be screened for sexually transmitted infections (STIs) including gonorrhea and chlamydia if:  You are sexually active and are younger than 56 years of age.  You are older than 56 years of age and your health care provider tells you that you are at risk for this type of infection.  Your sexual activity has changed since you were last screened and you are at an increased risk for chlamydia or gonorrhea. Ask your health care provider if you are at risk.  If you do not have HIV, but are at risk, it may be recommended that you take a prescription medicine daily to prevent HIV infection. This is called pre-exposure prophylaxis (PrEP). You are considered at risk if:  You are sexually active and do not regularly use condoms or know the HIV status of your partner(s).  You take drugs by  injection.  You are sexually active with a partner who has HIV. Talk with your health care provider about whether you are at high risk of being infected with HIV. If you choose to begin PrEP, you should first be tested for HIV. You should then be tested every 3 months for as long as you are taking PrEP. Pregnancy  If you are premenopausal and you may become pregnant, ask your health care provider about preconception counseling.  If you may become pregnant,  take 400 to 800 micrograms (mcg) of folic acid every day.  If you want to prevent pregnancy, talk to your health care provider about birth control (contraception). Osteoporosis and menopause  Osteoporosis is a disease in which the bones lose minerals and strength with aging. This can result in serious bone fractures. Your risk for osteoporosis can be identified using a bone density scan.  If you are 45 years of age or older, or if you are at risk for osteoporosis and fractures, ask your health care provider if you should be screened.  Ask your health care provider whether you should take a calcium or vitamin D supplement to lower your risk for osteoporosis.  Menopause may have certain physical symptoms and risks.  Hormone replacement therapy may reduce some of these symptoms and risks. Talk to your health care provider about whether hormone replacement therapy is right for you. Follow these instructions at home:  Schedule regular health, dental, and eye exams.  Stay current with your immunizations.  Do not use any tobacco products including cigarettes, chewing tobacco, or electronic cigarettes.  If you are pregnant, do not drink alcohol.  If you are breastfeeding, limit how much and how often you drink alcohol.  Limit alcohol intake to no more than 1 drink per day for nonpregnant women. One drink equals 12 ounces of beer, 5 ounces of wine, or 1 ounces of hard liquor.  Do not use street drugs.  Do not share needles.  Ask  your health care provider for help if you need support or information about quitting drugs.  Tell your health care provider if you often feel depressed.  Tell your health care provider if you have ever been abused or do not feel safe at home. This information is not intended to replace advice given to you by your health care provider. Make sure you discuss any questions you have with your health care provider. Document Released: 09/05/2010 Document Revised: 07/29/2015 Document Reviewed: 11/24/2014 Elsevier Interactive Patient Education  2017 [employer].

## 2016-07-26 ENCOUNTER — Telehealth: Payer: Self-pay

## 2016-07-26 ENCOUNTER — Other Ambulatory Visit: Payer: Self-pay | Admitting: Family Medicine

## 2016-07-26 DIAGNOSIS — D582 Other hemoglobinopathies: Secondary | ICD-10-CM

## 2016-07-26 LAB — TSH: TSH: 2.73 mIU/L

## 2016-07-26 NOTE — Telephone Encounter (Signed)
Pt is concern about her lab results.

## 2016-07-26 NOTE — Assessment & Plan Note (Signed)
Recheck CBC hydrated

## 2016-07-26 NOTE — Telephone Encounter (Signed)
I had not released them yet though MyChart (?) I called patient Discussed labs Recheck CBC in 1-2 weeks May be hemoconcentration, may be sleep apnea; she denies snoring Will just recheck I'm not overly worried about platelet count

## 2016-07-26 NOTE — Progress Notes (Signed)
Recheck CBC in a few weeks; if still elevated, consider sleep study

## 2016-11-02 ENCOUNTER — Telehealth: Payer: Self-pay | Admitting: Family Medicine

## 2016-11-02 NOTE — Telephone Encounter (Signed)
Left detailed vociemail

## 2016-11-02 NOTE — Telephone Encounter (Signed)
Please follow-up with patient about the outstanding Cologuard order We strongly encourage colon cancer screening, as colon cancer is the 3rd leading cause of death from cancer in both men and women, and early detection saves lives Thank you 

## 2016-11-02 NOTE — Telephone Encounter (Signed)
Patient was supposed to return for recheck CBC in late May; please ask her to have that done soon, NON-fasting, come well-hydrated Thank you High H/H can be a sign of sleep apnea, so if still elevated, she should be referred to Dr. Ashby Dawes or other sleep specialist for evaluation Thank you

## 2016-11-03 NOTE — Telephone Encounter (Signed)
Patient notified

## 2017-06-08 ENCOUNTER — Other Ambulatory Visit: Payer: Self-pay | Admitting: Family Medicine

## 2017-07-27 ENCOUNTER — Encounter: Payer: Self-pay | Admitting: Family Medicine

## 2017-07-27 ENCOUNTER — Ambulatory Visit (INDEPENDENT_AMBULATORY_CARE_PROVIDER_SITE_OTHER): Payer: Managed Care, Other (non HMO) | Admitting: Family Medicine

## 2017-07-27 ENCOUNTER — Other Ambulatory Visit: Payer: Self-pay | Admitting: Family Medicine

## 2017-07-27 VITALS — BP 136/82 | HR 96 | Temp 98.1°F | Resp 12 | Ht 64.5 in | Wt 165.6 lb

## 2017-07-27 DIAGNOSIS — D582 Other hemoglobinopathies: Secondary | ICD-10-CM

## 2017-07-27 DIAGNOSIS — Z1231 Encounter for screening mammogram for malignant neoplasm of breast: Secondary | ICD-10-CM | POA: Diagnosis not present

## 2017-07-27 DIAGNOSIS — Z0000 Encounter for general adult medical examination without abnormal findings: Secondary | ICD-10-CM

## 2017-07-27 DIAGNOSIS — Z1239 Encounter for other screening for malignant neoplasm of breast: Secondary | ICD-10-CM

## 2017-07-27 DIAGNOSIS — Z1211 Encounter for screening for malignant neoplasm of colon: Secondary | ICD-10-CM

## 2017-07-27 DIAGNOSIS — D696 Thrombocytopenia, unspecified: Secondary | ICD-10-CM

## 2017-07-27 NOTE — Progress Notes (Signed)
Elevated RBC; refer to pulm Mildly low PLT; recheck in 3 months; consider spleen US if persistent/dropping

## 2017-07-27 NOTE — Progress Notes (Signed)
Patient ID: Katelyn Ochoa, female   DOB: 01/08/1961, 57 y.o.   MRN: [id number]   Subjective:   Katelyn Ochoa is a 57 y.o. female here for a complete physical exam  Interim issues since last visit: no flu, no medical excitement  USPSTF grade A and B recommendations Depression:  Depression screen [facility] 2/9 07/27/2017 07/25/2016 07/22/2015  Decreased Interest 0 0 0  Down, Depressed, Hopeless 0 1 0  PHQ - 2 Score 0 1 0   Hypertension: BP Readings from Last 3 Encounters:  07/27/17 136/82  07/25/16 130/72  07/22/15 128/78   Obesity: Wt Readings from Last 3 Encounters:  07/27/17 165 lb 9.6 oz (75.1 kg)  07/25/16 175 lb 12.8 oz (79.7 kg)  07/22/15 178 lb 3 oz (80.8 kg)   BMI Readings from Last 3 Encounters:  07/27/17 27.99 kg/m  07/25/16 29.71 kg/m  07/22/15 29.65 kg/m     Skin cancer: no worrisome moles Lung cancer:  nonsmoker Breast cancer: mammogram ordered; no lumps Colorectal cancer: sent in the [facility], requesting results Cervical cancer screening: UTD BRCA gene screening: family hx of breast and/or ovarian cancer and/or metastatic prostate cancer? 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 Alcohol: occasionally Tobacco use: nonsmoker AAA: n/a Aspirin:  Glucose:  Glucose  Date Value Ref Range Status  07/29/2015 93 65 - 99 mg/dL Final  07/22/2015 96 65 - 99 mg/dL Final   Glucose, Bld  Date Value Ref Range Status  07/25/2016 91 65 - 99 mg/dL Final   Lipids:   Lab Results  Component Value Date   CHOL 226 (H) 07/25/2016   CHOL 217 (H) 07/22/2015   Lab Results  Component Value Date   HDL 79 07/25/2016   HDL 77 07/22/2015   Lab Results  Component Value Date   LDLCALC 130 (H) 07/25/2016   LDLCALC 123 (H) 07/22/2015   Lab Results  Component Value Date   TRIG 83 07/25/2016   TRIG 86 07/22/2015   Lab Results  Component Value Date   CHOLHDL 2.9 07/25/2016   No results found for: LDLDIRECT   Past Medical History:  Diagnosis Date  . Abnormal Pap smear of cervix maybe 2002-2007   hx of LEEP   History reviewed. No pertinent surgical history. Family History  Problem Relation Age of Onset  . Hyperlipidemia Mother   . Heart disease Father   . COPD Father   . Breast cancer Daughter 19  . Cancer Daughter        breast  . Breast cancer Maternal Aunt   . Colon cancer Neg Hx   . Ovarian cancer Neg Hx    Social History   Tobacco Use  . Smoking status: Never Smoker  . Smokeless tobacco: Never Used  Substance Use Topics  . Alcohol use: Yes    Comment: occasional  . Drug use: No   Review of Systems  Objective:   Vitals:   07/27/17 0823  BP: 136/82  Pulse: 96  Resp: 12  Temp: 98.1 F (36.7 C)  TempSrc: Oral  SpO2:  96%  Weight: 165 lb 9.6 oz (75.1 kg)  Height: 5' 4.5" (1.638 m)   Body mass index is 27.99 kg/m. Wt Readings from Last 3 Encounters:  07/27/17 165 lb 9.6 oz (75.1 kg)  07/25/16 175 lb 12.8 oz (79.7 kg)  07/22/15 178 lb 3 oz (80.8 kg)   Physical Exam  Constitutional: She appears well-developed and well-nourished.  HENT:  Head: Normocephalic and atraumatic.  Right Ear: Hearing, tympanic membrane, external ear and ear canal normal.  Left Ear: Hearing, tympanic membrane, external ear and ear canal normal.  Eyes: Conjunctivae and EOM are normal. Right eye exhibits no hordeolum. Left eye exhibits no hordeolum. No scleral icterus.  Neck: Carotid bruit is not present. No thyromegaly present.  Cardiovascular:  Normal rate, regular rhythm, S1 normal, S2 normal and normal heart sounds.  No extrasystoles are present.  Pulmonary/Chest: Effort normal and breath sounds normal. No respiratory distress. Right breast exhibits no inverted nipple, no mass, no nipple discharge, no skin change and no tenderness. Left breast exhibits no inverted nipple, no mass, no nipple discharge, no skin change and no tenderness. Breasts are symmetrical.  Abdominal: Soft. Normal appearance and bowel sounds are normal. She exhibits no distension, no abdominal bruit, no pulsatile midline mass and no mass. There is no hepatosplenomegaly. There is no tenderness. No hernia.  Musculoskeletal: Normal range of motion. She exhibits no edema.  Lymphadenopathy:       Head (right side): No submandibular adenopathy present.       Head (left side): No submandibular adenopathy present.    She has no cervical adenopathy.    She has no axillary adenopathy.  Neurological: She is alert. She displays no tremor. No cranial nerve deficit. She exhibits normal muscle tone. Gait normal.  Reflex Scores:      Patellar reflexes are 2+ on the right side and 2+ on the left side. Skin: Skin is warm and dry. No bruising and no ecchymosis noted. No cyanosis. No pallor.  Psychiatric: Her speech is normal and behavior is normal. Thought content normal. Her mood appears not anxious. She does not exhibit a depressed mood.    Assessment/Plan:   Problem List Items Addressed This Visit      Other   Preventative health care - Primary    USPSTF grade A and B recommendations reviewed with patient; age-appropriate recommendations, preventive care, screening tests, etc discussed and encouraged; healthy living encouraged; see AVS for patient education given to patient Calculated her 10 year ASCVD risk, aspirin guide used; she does not meet criteria for aspirin use; reviewed with patient      Relevant Orders   CBC with Differential/Platelet   Lipid panel   TSH    Comprehensive metabolic panel   Colon cancer screening    Staff member called [facility]; they never received her package; she was not charged; patient informed; she is going to repeat the test       Other Visit Diagnoses    Encounter for screening breast examination       Relevant Orders   MM Digital Screening   Screen for colon cancer       Relevant Orders   [facility]       No orders of the defined types were placed in this encounter.  Orders Placed This Encounter  Procedures  . MM Digital Screening    Standing Status:   Future    Standing Expiration Date:   09/27/2018    Order Specific Question:   Reason for  Exam (SYMPTOM  OR DIAGNOSIS REQUIRED)    Answer:   screen for breast exam    Order Specific Question:   Is the patient pregnant?    Answer:   No    Order Specific Question:   Preferred imaging location?    Answer:   [facility]  . [facility]  . CBC with Differential/Platelet  . Lipid panel  . TSH  . Comprehensive metabolic panel    Follow up plan: Return in about 1 year (around 07/28/2018) for complete physical.  An After Visit Summary was printed and given to the patient.

## 2017-07-27 NOTE — Assessment & Plan Note (Addendum)
USPSTF grade A and B recommendations reviewed with patient; age-appropriate recommendations, preventive care, screening tests, etc discussed and encouraged; healthy living encouraged; see AVS for patient education given to patient Calculated her 10 year ASCVD risk, aspirin guide used; she does not meet criteria for aspirin use; reviewed with patient

## 2017-07-27 NOTE — Patient Instructions (Addendum)
Consider getting the new shingles vaccine called Shingrix; that is available for individuals 57 years of age and older, and is recommended even if you have had shingles in the past and/or already received the old shingles vaccine (Zostavax); it is a two-part series, and is available at many local pharmacies  Let's get labs If you have not heard anything from my staff in a week about any orders/referrals/studies from today, please contact us here to follow-up [phone]  Health Maintenance, Female Adopting a healthy lifestyle and getting preventive care can go a long way to promote health and wellness. Talk with your health care provider about what schedule of regular examinations is right for you. This is a good chance for you to check in with your provider about disease prevention and staying healthy. In between checkups, there are plenty of things you can do on your own. Experts have done a lot of research about which lifestyle changes and preventive measures are most likely to keep you healthy. Ask your health care provider for more information. Weight and diet Eat a healthy diet  Be sure to include plenty of vegetables, fruits, low-fat dairy products, and lean protein.  Do not eat a lot of foods high in solid fats, added sugars, or salt.  Get regular exercise. This is one of the most important things you can do for your health. ? Most adults should exercise for at least 150 minutes each week. The exercise should increase your heart rate and make you sweat (moderate-intensity exercise). ? Most adults should also do strengthening exercises at least twice a week. This is in addition to the moderate-intensity exercise.  Maintain a healthy weight  Body mass index (BMI) is a measurement that can be used to identify possible weight problems. It estimates body fat based on height and weight. Your health care provider can help determine your BMI and help you achieve or maintain a healthy  weight.  For females 50 years of age and older: ? A BMI below 18.5 is considered underweight. ? A BMI of 18.5 to 24.9 is normal. ? A BMI of 25 to 29.9 is considered overweight. ? A BMI of 30 and above is considered obese.  Watch levels of cholesterol and blood lipids  You should start having your blood tested for lipids and cholesterol at 57 years of age, then have this test every 5 years.  You may need to have your cholesterol levels checked more often if: ? Your lipid or cholesterol levels are high. ? You are older than 57 years of age. ? You are at high risk for heart disease.  Cancer screening Lung Cancer  Lung cancer screening is recommended for adults 79-9 years old who are at high risk for lung cancer because of a history of smoking.  A yearly low-dose CT scan of the lungs is recommended for people who: ? Currently smoke. ? Have quit within the past 15 years. ? Have at least a 30-pack-year history of smoking. A pack year is smoking an average of one pack of cigarettes a day for 1 year.  Yearly screening should continue until it has been 15 years since you quit.  Yearly screening should stop if you develop a health problem that would prevent you from having lung cancer treatment.  Breast Cancer  Practice breast self-awareness. This means understanding how your breasts normally appear and feel.  It also means doing regular breast self-exams. Let your health care provider know about any changes, no matter  how small.  If you are in your 20s or 30s, you should have a clinical breast exam (CBE) by a health care provider every 1-3 years as part of a regular health exam.  If you are 74 or older, have a CBE every year. Also consider having a breast X-ray (mammogram) every year.  If you have a family history of breast cancer, talk to your health care provider about genetic screening.  If you are at high risk for breast cancer, talk to your health care provider about having an  MRI and a mammogram every year.  Breast cancer gene (BRCA) assessment is recommended for women who have family members with BRCA-related cancers. BRCA-related cancers include: ? Breast. ? Ovarian. ? Tubal. ? Peritoneal cancers.  Results of the assessment will determine the need for genetic counseling and BRCA1 and BRCA2 testing.  Cervical Cancer Your health care provider may recommend that you be screened regularly for cancer of the pelvic organs (ovaries, uterus, and vagina). This screening involves a pelvic examination, including checking for microscopic changes to the surface of your cervix (Pap test). You may be encouraged to have this screening done every 3 years, beginning at age 58.  For women ages 49-65, health care providers may recommend pelvic exams and Pap testing every 3 years, or they may recommend the Pap and pelvic exam, combined with testing for human papilloma virus (HPV), every 5 years. Some types of HPV increase your risk of cervical cancer. Testing for HPV may also be done on women of any age with unclear Pap test results.  Other health care providers may not recommend any screening for nonpregnant women who are considered low risk for pelvic cancer and who do not have symptoms. Ask your health care provider if a screening pelvic exam is right for you.  If you have had past treatment for cervical cancer or a condition that could lead to cancer, you need Pap tests and screening for cancer for at least 20 years after your treatment. If Pap tests have been discontinued, your risk factors (such as having a new sexual partner) need to be reassessed to determine if screening should resume. Some women have medical problems that increase the chance of getting cervical cancer. In these cases, your health care provider may recommend more frequent screening and Pap tests.  Colorectal Cancer  This type of cancer can be detected and often prevented.  Routine colorectal cancer screening  usually begins at 57 years of age and continues through 57 years of age.  Your health care provider may recommend screening at an earlier age if you have risk factors for colon cancer.  Your health care provider may also recommend using home test kits to check for hidden blood in the stool.  A small camera at the end of a tube can be used to examine your colon directly (sigmoidoscopy or colonoscopy). This is done to check for the earliest forms of colorectal cancer.  Routine screening usually begins at age 7.  Direct examination of the colon should be repeated every 5-10 years through 57 years of age. However, you may need to be screened more often if early forms of precancerous polyps or small growths are found.  Skin Cancer  Check your skin from head to toe regularly.  Tell your health care provider about any new moles or changes in moles, especially if there is a change in a mole's shape or color.  Also tell your health care provider if you have a  mole that is larger than the size of a pencil eraser.  Always use sunscreen. Apply sunscreen liberally and repeatedly throughout the day.  Protect yourself by wearing long sleeves, pants, a wide-brimmed hat, and sunglasses whenever you are outside.  Heart disease, diabetes, and high blood pressure  High blood pressure causes heart disease and increases the risk of stroke. High blood pressure is more likely to develop in: ? People who have blood pressure in the high end of the normal range (130-139/85-89 mm Hg). ? People who are overweight or obese. ? People who are African American.  If you are 46-65 years of age, have your blood pressure checked every 3-5 years. If you are 28 years of age or older, have your blood pressure checked every year. You should have your blood pressure measured twice-once when you are at a hospital or clinic, and once when you are not at a hospital or clinic. Record the average of the two measurements. To check  your blood pressure when you are not at a hospital or clinic, you can use: ? An automated blood pressure machine at a pharmacy. ? A home blood pressure monitor.  If you are between 54 years and 60 years old, ask your health care provider if you should take aspirin to prevent strokes.  Have regular diabetes screenings. This involves taking a blood sample to check your fasting blood sugar level. ? If you are at a normal weight and have a low risk for diabetes, have this test once every three years after 57 years of age. ? If you are overweight and have a high risk for diabetes, consider being tested at a younger age or more often. Preventing infection Hepatitis B  If you have a higher risk for hepatitis B, you should be screened for this virus. You are considered at high risk for hepatitis B if: ? You were born in a country where hepatitis B is common. Ask your health care provider which countries are considered high risk. ? Your parents were born in a high-risk country, and you have not been immunized against hepatitis B (hepatitis B vaccine). ? You have HIV or AIDS. ? You use needles to inject street drugs. ? You live with someone who has hepatitis B. ? You have had sex with someone who has hepatitis B. ? You get hemodialysis treatment. ? You take certain medicines for conditions, including cancer, organ transplantation, and autoimmune conditions.  Hepatitis C  Blood testing is recommended for: ? Everyone born from [birth year] through 1965. ? Anyone with known risk factors for hepatitis C.  Sexually transmitted infections (STIs)  You should be screened for sexually transmitted infections (STIs) including gonorrhea and chlamydia if: ? You are sexually active and are younger than 57 years of age. ? You are older than 57 years of age and your health care provider tells you that you are at risk for this type of infection. ? Your sexual activity has changed since you were last screened and you  are at an increased risk for chlamydia or gonorrhea. Ask your health care provider if you are at risk.  If you do not have HIV, but are at risk, it may be recommended that you take a prescription medicine daily to prevent HIV infection. This is called pre-exposure prophylaxis (PrEP). You are considered at risk if: ? You are sexually active and do not regularly use condoms or know the HIV status of your partner(s). ? You take drugs by injection. ? You  are sexually active with a partner who has HIV.  Talk with your health care provider about whether you are at high risk of being infected with HIV. If you choose to begin PrEP, you should first be tested for HIV. You should then be tested every 3 months for as long as you are taking PrEP. Pregnancy  If you are premenopausal and you may become pregnant, ask your health care provider about preconception counseling.  If you may become pregnant, take 400 to 800 micrograms (mcg) of folic acid every day.  If you want to prevent pregnancy, talk to your health care provider about birth control (contraception). Osteoporosis and menopause  Osteoporosis is a disease in which the bones lose minerals and strength with aging. This can result in serious bone fractures. Your risk for osteoporosis can be identified using a bone density scan.  If you are 26 years of age or older, or if you are at risk for osteoporosis and fractures, ask your health care provider if you should be screened.  Ask your health care provider whether you should take a calcium or vitamin D supplement to lower your risk for osteoporosis.  Menopause may have certain physical symptoms and risks.  Hormone replacement therapy may reduce some of these symptoms and risks. Talk to your health care provider about whether hormone replacement therapy is right for you. Follow these instructions at home:  Schedule regular health, dental, and eye exams.  Stay current with your  immunizations.  Do not use any tobacco products including cigarettes, chewing tobacco, or electronic cigarettes.  If you are pregnant, do not drink alcohol.  If you are breastfeeding, limit how much and how often you drink alcohol.  Limit alcohol intake to no more than 1 drink per day for nonpregnant women. One drink equals 12 ounces of beer, 5 ounces of wine, or 1 ounces of hard liquor.  Do not use street drugs.  Do not share needles.  Ask your health care provider for help if you need support or information about quitting drugs.  Tell your health care provider if you often feel depressed.  Tell your health care provider if you have ever been abused or do not feel safe at home. This information is not intended to replace advice given to you by your health care provider. Make sure you discuss any questions you have with your health care provider. Document Released: 09/05/2010 Document Revised: 07/29/2015 Document Reviewed: 11/24/2014 Elsevier Interactive Patient Education  [employer].

## 2017-07-27 NOTE — Assessment & Plan Note (Signed)
Staff member called [facility]; they never received her package; she was not charged; patient informed; she is going to repeat the test

## 2017-07-28 LAB — COMPREHENSIVE METABOLIC PANEL
ALBUMIN: 5.1 g/dL (ref 3.5–5.5)
ALT: 19 IU/L (ref 0–32)
AST: 20 IU/L (ref 0–40)
Albumin/Globulin Ratio: 2.1 (ref 1.2–2.2)
Alkaline Phosphatase: 103 IU/L (ref 39–117)
BUN / CREAT RATIO: 19 (ref 9–23)
BUN: 13 mg/dL (ref 6–24)
Bilirubin Total: 0.4 mg/dL (ref 0.0–1.2)
CALCIUM: 10.3 mg/dL — AB (ref 8.7–10.2)
CO2: 22 mmol/L (ref 20–29)
CREATININE: 0.7 mg/dL (ref 0.57–1.00)
Chloride: 104 mmol/L (ref 96–106)
GFR calc Af Amer: 111 mL/min/{1.73_m2} (ref >59)
GFR, EST NON AFRICAN AMERICAN: 96 mL/min/{1.73_m2} (ref >59)
GLOBULIN, TOTAL: 2.4 g/dL (ref 1.5–4.5)
Glucose: 84 mg/dL (ref 65–99)
Potassium: 3.8 mmol/L (ref 3.5–5.2)
SODIUM: 143 mmol/L (ref 134–144)
TOTAL PROTEIN: 7.5 g/dL (ref 6.0–8.5)

## 2017-07-28 LAB — CBC WITH DIFFERENTIAL/PLATELET
BASOS: 1 %
Basophils Absolute: 0 10*3/uL (ref 0.0–0.2)
EOS (ABSOLUTE): 0.2 10*3/uL (ref 0.0–0.4)
EOS: 4 %
Hematocrit: 48.8 % — ABNORMAL HIGH (ref 34.0–46.6)
Hemoglobin: 15.9 g/dL (ref 11.1–15.9)
IMMATURE GRANS (ABS): 0 10*3/uL (ref 0.0–0.1)
IMMATURE GRANULOCYTES: 0 %
LYMPHS: 32 %
Lymphocytes Absolute: 1.8 10*3/uL (ref 0.7–3.1)
MCH: 30.1 pg (ref 26.6–33.0)
MCHC: 32.6 g/dL (ref 31.5–35.7)
MCV: 92 fL (ref 79–97)
Monocytes Absolute: 0.5 10*3/uL (ref 0.1–0.9)
Monocytes: 8 %
NEUTROS PCT: 55 %
Neutrophils Absolute: 3.3 10*3/uL (ref 1.4–7.0)
Platelets: 138 10*3/uL — ABNORMAL LOW (ref 150–450)
RBC: 5.29 x10E6/uL — ABNORMAL HIGH (ref 3.77–5.28)
RDW: 13.6 % (ref 12.3–15.4)
WBC: 5.8 10*3/uL (ref 3.4–10.8)

## 2017-07-28 LAB — TSH: TSH: 3.53 u[IU]/mL (ref 0.450–4.500)

## 2017-07-28 LAB — LIPID PANEL
CHOL/HDL RATIO: 2.8 ratio (ref 0.0–4.4)
Cholesterol, Total: 230 mg/dL — ABNORMAL HIGH (ref 100–199)
HDL: 81 mg/dL (ref >39)
LDL Calculated: 135 mg/dL — ABNORMAL HIGH (ref 0–99)
Triglycerides: 72 mg/dL (ref 0–149)
VLDL CHOLESTEROL CAL: 14 mg/dL (ref 5–40)

## 2017-08-10 ENCOUNTER — Other Ambulatory Visit: Payer: Self-pay

## 2017-08-10 ENCOUNTER — Telehealth: Payer: Self-pay

## 2017-08-10 DIAGNOSIS — Z139 Encounter for screening, unspecified: Secondary | ICD-10-CM

## 2017-08-10 DIAGNOSIS — Z0000 Encounter for general adult medical examination without abnormal findings: Secondary | ICD-10-CM

## 2017-08-10 NOTE — Telephone Encounter (Signed)
Copied from [place] [id number]. Topic: General - Other >> Aug 10, 2017 10:19 AM Valla Leaver wrote: Reason for CRM: Patient will be faxing a form for Dr. Sanda Klein to the office today. Its a form she forgot to leave it after her appointment on 05/24. It's to document her CPE for work.

## 2017-08-10 NOTE — Telephone Encounter (Signed)
Thank you :)

## 2017-08-17 LAB — COLOGUARD: COLOGUARD: NEGATIVE

## 2017-08-22 ENCOUNTER — Other Ambulatory Visit: Payer: Self-pay | Admitting: Family Medicine

## 2017-08-22 ENCOUNTER — Ambulatory Visit
Admission: RE | Admit: 2017-08-22 | Discharge: 2017-08-22 | Disposition: A | Discharge status: Home / Self Care | Payer: Managed Care, Other (non HMO) | Source: Ambulatory Visit | Attending: Family Medicine | Admitting: Family Medicine

## 2017-08-22 DIAGNOSIS — Z1231 Encounter for screening mammogram for malignant neoplasm of breast: Secondary | ICD-10-CM | POA: Insufficient documentation

## 2017-08-22 DIAGNOSIS — Z1239 Encounter for other screening for malignant neoplasm of breast: Secondary | ICD-10-CM

## 2017-08-23 ENCOUNTER — Encounter: Payer: Self-pay | Admitting: Family Medicine

## 2017-08-23 ENCOUNTER — Other Ambulatory Visit: Payer: Self-pay | Admitting: Family Medicine

## 2017-08-23 DIAGNOSIS — N6310 Unspecified lump in the right breast, unspecified quadrant: Secondary | ICD-10-CM

## 2017-08-23 DIAGNOSIS — R928 Other abnormal and inconclusive findings on diagnostic imaging of breast: Secondary | ICD-10-CM

## 2017-08-24 ENCOUNTER — Encounter: Payer: Self-pay | Admitting: Family Medicine

## 2017-08-24 LAB — MEASLES/MUMPS/RUBELLA IMMUNITY
MUMPS ABS, IGG: 82.8 AU/mL (ref >10.9)
RUBEOLA AB, IGG: <25 AU/mL — ABNORMAL LOW (ref >29.9)
Rubella Antibodies, IGG: 1.07 index (ref >0.99)

## 2017-08-24 LAB — HEMOGLOBIN A1C
ESTIMATED AVERAGE GLUCOSE: 108 mg/dL
Hgb A1c MFr Bld: 5.4 % (ref 4.8–5.6)

## 2017-08-27 ENCOUNTER — Ambulatory Visit (INDEPENDENT_AMBULATORY_CARE_PROVIDER_SITE_OTHER): Payer: Managed Care, Other (non HMO)

## 2017-08-27 DIAGNOSIS — Z23 Encounter for immunization: Secondary | ICD-10-CM | POA: Diagnosis not present

## 2017-08-27 NOTE — Telephone Encounter (Signed)
Form filled out; ready for her

## 2017-08-28 ENCOUNTER — Encounter: Payer: Self-pay | Admitting: Family Medicine

## 2017-09-03 ENCOUNTER — Ambulatory Visit
Admission: RE | Admit: 2017-09-03 | Discharge: 2017-09-03 | Disposition: A | Discharge status: Home / Self Care | Payer: Managed Care, Other (non HMO) | Source: Ambulatory Visit | Attending: Family Medicine | Admitting: Family Medicine

## 2017-09-03 DIAGNOSIS — R928 Other abnormal and inconclusive findings on diagnostic imaging of breast: Secondary | ICD-10-CM | POA: Insufficient documentation

## 2017-09-03 DIAGNOSIS — N6310 Unspecified lump in the right breast, unspecified quadrant: Secondary | ICD-10-CM | POA: Insufficient documentation

## 2018-07-22 ENCOUNTER — Other Ambulatory Visit: Payer: Self-pay | Admitting: Family Medicine

## 2018-07-22 DIAGNOSIS — Z1231 Encounter for screening mammogram for malignant neoplasm of breast: Secondary | ICD-10-CM

## 2018-07-30 ENCOUNTER — Ambulatory Visit (INDEPENDENT_AMBULATORY_CARE_PROVIDER_SITE_OTHER): Payer: Managed Care, Other (non HMO) | Admitting: Nurse Practitioner

## 2018-07-30 ENCOUNTER — Other Ambulatory Visit: Payer: Self-pay

## 2018-07-30 ENCOUNTER — Encounter: Payer: Self-pay | Admitting: Nurse Practitioner

## 2018-07-30 VITALS — BP 126/80 | HR 93 | Temp 98.2°F | Resp 14 | Ht 64.0 in | Wt 172.3 lb

## 2018-07-30 DIAGNOSIS — Z0000 Encounter for general adult medical examination without abnormal findings: Secondary | ICD-10-CM

## 2018-07-30 DIAGNOSIS — Z124 Encounter for screening for malignant neoplasm of cervix: Secondary | ICD-10-CM | POA: Diagnosis not present

## 2018-07-30 DIAGNOSIS — E785 Hyperlipidemia, unspecified: Secondary | ICD-10-CM

## 2018-07-30 DIAGNOSIS — Z131 Encounter for screening for diabetes mellitus: Secondary | ICD-10-CM

## 2018-07-30 DIAGNOSIS — R718 Other abnormality of red blood cells: Secondary | ICD-10-CM

## 2018-07-30 NOTE — Progress Notes (Signed)
Name: Katelyn Ochoa   MRN: [id number]    DOB: [birth year]   Date:07/30/2018       Progress Note  Subjective  Chief Complaint  Chief Complaint  Patient presents with  . Annual Exam    HPI   Patient presents for annual CPE.  Diet:  Patient states recently has been eating anything and everything. Tried a relaxed keto diet for a bit.  Breakfast- slimfast shake 10-am nuts or fruits Lunch- salad, frittata,  Afternoon snack- fruits or nuts, m&ms  Supper- grilled porkchops, greenbeans Has 1-2 servings a fruit and vegetables a day.  Drink: water - all day.  Exercise:  Walks on treadmill from 30-60 minutes a few times a week; goes up and down steps at work.   USPSTF grade A and B recommendations    Office Visit from 07/30/2018 in [facility]  AUDIT-C Score  1     Depression: Phq 9 is  negative Depression screen [facility] 2/9 07/30/2018 07/27/2017 07/25/2016 07/22/2015  Decreased Interest 0 0 0 0  Down, Depressed, Hopeless 0 0 1 0  PHQ - 2 Score 0 0 1 0  Altered sleeping 0 - - -  Tired, decreased energy 0 - - -  Change in appetite 0 - - -  Feeling bad or failure about yourself  0 - - -  Trouble concentrating 0 - - -  Moving slowly or fidgety/restless 0 - - -  Suicidal thoughts 0 - - -  PHQ-9 Score 0 - - -  Difficult doing work/chores Not difficult at all - - -   Hypertension: BP Readings from Last 3 Encounters:  07/30/18 126/80  07/27/17 136/82  07/25/16 130/72   Obesity: Wt Readings from Last 3 Encounters:  07/30/18 172 lb 4.8 oz (78.2 kg)  07/27/17 165 lb 9.6 oz (75.1 kg)  07/25/16 175 lb 12.8 oz (79.7 kg)   BMI Readings from Last 3 Encounters:  07/30/18 29.58 kg/m  07/27/17 27.99 kg/m  07/25/16 29.71 kg/m    Hep C Screening:  STD testing and prevention (HIV/chl/gon/syphilis): declines  Intimate partner violence: denies  Sexual History/Pain during Intercourse: sexually active, no pain Menstrual History/LMP/Abnormal Bleeding: post-menopausal,  denies bleeding Incontinence Symptoms: denies   Advanced Care Planning: A voluntary discussion about advance care planning including the explanation and discussion of advance directives.  Discussed health care proxy and Living will, and the patient was able to identify a health care proxy as husband Shaylynn Nulty.  Patient does not have a living will at present time. If patient does have living will, I have requested they bring this to the clinic to be scanned in to their chart.  Breast cancer: daughter had breast cancer with double mastectomy, last year mammogram showed No evidence of malignancy. Benign intramammary lymph node within the outer RIGHT breast. Diagnostic mammogram confirming benign appearance.   Cervical cancer screening: due today   Osteoporosis Screening:  No results found for: HMDEXASCAN  Lipids:  Lab Results  Component Value Date   CHOL 230 (H) 07/27/2017   CHOL 226 (H) 07/25/2016   CHOL 217 (H) 07/22/2015   Lab Results  Component Value Date   HDL 81 07/27/2017   HDL 79 07/25/2016   HDL 77 07/22/2015   Lab Results  Component Value Date   LDLCALC 135 (H) 07/27/2017   LDLCALC 130 (H) 07/25/2016   LDLCALC 123 (H) 07/22/2015   Lab Results  Component Value Date   TRIG 72 07/27/2017   TRIG 83  07/25/2016   TRIG 86 07/22/2015   Lab Results  Component Value Date   CHOLHDL 2.8 07/27/2017   CHOLHDL 2.9 07/25/2016   No results found for: LDLDIRECT  Glucose:  Glucose  Date Value Ref Range Status  07/27/2017 84 65 - 99 mg/dL Final  07/29/2015 93 65 - 99 mg/dL Final  07/22/2015 96 65 - 99 mg/dL Final   Glucose, Bld  Date Value Ref Range Status  07/25/2016 91 65 - 99 mg/dL Final   Results of the Epworth flowsheet 07/30/2018  Sitting and reading 0  Watching TV 2  Sitting, inactive in a public place (e.g. a theatre or a meeting) 0  As a passenger in a car for an hour without a break 0  Lying down to rest in the afternoon when circumstances permit 3   Sitting and talking to someone 0  Sitting quietly after a lunch without alcohol 0  In a car, while stopped for a few minutes in traffic 0  Total score 5     Skin cancer: uses sunscreen, no history of skin cancer.  Colorectal cancer: cologuard due 2022  Lung cancer:  Low Dose CT Chest recommended if Age 49-80 years, 30 pack-year currently smoking OR have quit w/in 15years. Patient does not qualify.     Patient Active Problem List   Diagnosis Date Noted  . Elevated hemoglobin ([place]) 07/26/2016  . Colon cancer screening 07/22/2015  . Preventative health care 07/22/2015  . Neoplasm of uncertain behavior of skin of lower leg 07/22/2015  . Pap smear for cervical cancer screening 07/22/2015    History reviewed. No pertinent surgical history.  Family History  Problem Relation Age of Onset  . Hyperlipidemia Mother   . Heart disease Father   . COPD Father   . Breast cancer Daughter 67  . Cancer Daughter        breast  . Breast cancer Maternal Aunt   . Colon cancer Neg Hx   . Ovarian cancer Neg Hx     Social History   Socioeconomic History  . Marital status: Married    Spouse name: Patrick Jupiter  . Number of children: 3  . Years of education: 30  . Highest education level: High school graduate  Occupational History  . Not on file  Social Needs  . Financial resource strain: Not hard at all  . Food insecurity:    Worry: Never true    Inability: Never true  . Transportation needs:    Medical: No    Non-medical: No  Tobacco Use  . Smoking status: Never Smoker  . Smokeless tobacco: Never Used  Substance and Sexual Activity  . Alcohol use: Yes    Comment: occasional  . Drug use: No  . Sexual activity: Yes    Birth control/protection: Condom  Lifestyle  . Physical activity:    Days per week: 5 days    Minutes per session: 30 min  . Stress: Not at all  Relationships  . Social connections:    Talks on phone: More than three times a week    Gets together: Twice a week     Attends religious service: 1 to 4 times per year    Active member of club or organization: Yes    Attends meetings of clubs or organizations: Never    Relationship status: Married  . Intimate partner violence:    Fear of current or ex partner: No    Emotionally abused: No    Physically abused: No  Forced sexual activity: No  Other Topics Concern  . Not on file  Social History Narrative  . Not on file     Current Outpatient Medications:  Marland Kitchen  Multiple Vitamin (MULTIVITAMIN) tablet, Take 1 tablet by mouth daily., Disp: , Rfl:  .  Vit B6-Vit B12-Omega 3 Acids (VITAMIN B PLUS+ PO), Take 1 tablet by mouth. , Disp: , Rfl:   Allergies  Allergen Reactions  . Penicillins Rash     Review of Systems  Constitutional: Negative for chills, fever and malaise/fatigue.  HENT: Negative for congestion, sinus pain and sore throat.   Eyes: Negative for blurred vision (wears glasess) and double vision.  Respiratory: Negative for cough and shortness of breath.   Cardiovascular: Negative for chest pain, palpitations and leg swelling.  Gastrointestinal: Negative for abdominal pain, blood in stool, constipation, diarrhea and nausea.  Genitourinary: Negative for dysuria.  Musculoskeletal: Negative for falls and joint pain.  Skin: Negative for rash.  Neurological: Negative for dizziness and headaches.  Endo/Heme/Allergies: Negative for polydipsia.  Psychiatric/Behavioral: The patient is not nervous/anxious and does not have insomnia.      Objective  Vitals:   07/30/18 1523  BP: 126/80  Pulse: 93  Resp: 14  Temp: 98.2 F (36.8 C)  TempSrc: Oral  SpO2: 95%  Weight: 172 lb 4.8 oz (78.2 kg)  Height: 5\' 4"  (1.626 m)    Body mass index is 29.58 kg/m.  Physical Exam  Constitutional: Patient appears well-developed and well-nourished. No distress.  HENT: Head: Normocephalic and atraumatic. Ears: B TMs ok, no erythema or effusion;  Eyes: Conjunctivae and EOM are normal. Pupils are equal,  round, and reactive to light. No scleral icterus.  Neck: Normal range of motion. Neck supple. No JVD present. No thyromegaly present.  Cardiovascular: Normal rate, regular rhythm and normal heart sounds.  No murmur heard. No BLE edema. Pulmonary/Chest: Effort normal and breath sounds normal. No respiratory distress. Abdominal: Soft. Bowel sounds are normal, no distension. There is no tenderness. no masses Breast: no lumps or masses, no nipple discharge or rashes FEMALE GENITALIA:  External genitalia normal External urethra normal Vaginal vault normal without discharge or lesions Cervix normal without discharge or lesions Bimanual exam normal without masses Musculoskeletal: Normal range of motion, no joint effusions. No gross deformities Neurological: he is alert and oriented to person, place, and time. No cranial nerve deficit. Coordination, balance, strength, speech and gait are normal.  Skin: Skin is warm and dry. No rash noted. No erythema.  Psychiatric: Patient has a normal mood and affect. behavior is normal. Judgment and thought content normal.     Fall Risk: Fall Risk  07/30/2018 07/27/2017 07/25/2016 07/22/2015  Falls in the past year? 0 No Yes No  Number falls in past yr: - - 1 -  Injury with Fall? - - No -     Functional Status Survey: Is the patient deaf or have difficulty hearing?: No Does the patient have difficulty seeing, even when wearing glasses/contacts?: No Does the patient have difficulty concentrating, remembering, or making decisions?: No Does the patient have difficulty walking or climbing stairs?: No Does the patient have difficulty dressing or bathing?: No Does the patient have difficulty doing errands alone such as visiting a doctor's office or shopping?: No   Assessment & Plan  1. Routine general medical examination at a health care facility - CBC w/Diff/Platelet - Comprehensive Metabolic Panel (CMET) - Hepatitis C Antibody  2. Encounter for  Papanicolaou smear for cervical cancer screening - Cytology -  PAP - Pap IG w/ reflex to HPV when ASC-U  3. Elevated lipids Discussed ways to lower lipids - Lipid Profile  4. Screening for diabetes mellitus - Comprehensive Metabolic Panel (CMET)  5. Elevated hematocrit Declined sleep study.  - CBC w/Diff/Platelet  -USPSTF grade A and B recommendations reviewed with patient; age-appropriate recommendations, preventive care, screening tests, etc discussed and encouraged; healthy living encouraged; see AVS for patient education given to patient -Discussed importance of 150 minutes of physical activity weekly, eat two servings of fish weekly, eat one serving of tree nuts ( cashews, pistachios, pecans, almonds.Marland Kitchen) every other day, eat 6 servings of fruit/vegetables daily and drink plenty of water and avoid sweet beverages.

## 2018-07-30 NOTE — Patient Instructions (Addendum)
General recommendations: 150 minutes of physical activity weekly, eat two servings of fish weekly, eat one serving of tree nuts ( cashews, pistachios, pecans, almonds.Marland Kitchen) every other day, eat 6 servings of fruit/vegetables daily and drink plenty of water and avoid sweet beverages.   Please do call to schedule your mammogram; the number to schedule one at either [facility] or [facility] is [phone]  Bad cholesterol, also called low-density lipoprotein (LDL), carries cholesterol and other fats that your liver makes to your body tissue. If it builds up in blood vessels, LDL can cause heart disease and other health problems. Your LDL level should be below 100. If you have diabetes or a possible heart problem, your LDL should be below 70.  Eat: Eat 20 to 30 grams of soluble fiber every day. Foods such as fruits and vegetables, whole grains, beans, peas, nuts, and seeds can help lower LDL. Avoid: Saturated fats (Dairy foods - such as butter, cream, ghee, regular-fat milk and cheese. Meat - such as fatty cuts of beef, pork and lamb, processed meats like salami, sausages and the skin on chicken. Lard., fatty snack foods, cakes, biscuits, pies and deep fried foods)

## 2018-08-01 ENCOUNTER — Other Ambulatory Visit: Payer: Self-pay | Admitting: Nurse Practitioner

## 2018-08-01 DIAGNOSIS — Z1231 Encounter for screening mammogram for malignant neoplasm of breast: Secondary | ICD-10-CM

## 2018-08-09 LAB — PAP IG W/ RFLX HPV ASCU

## 2018-08-26 ENCOUNTER — Ambulatory Visit
Admission: RE | Admit: 2018-08-26 | Discharge: 2018-08-26 | Disposition: A | Discharge status: Home / Self Care | Payer: Managed Care, Other (non HMO) | Source: Ambulatory Visit | Attending: Nurse Practitioner | Admitting: Nurse Practitioner

## 2018-08-26 ENCOUNTER — Other Ambulatory Visit: Payer: Self-pay

## 2018-08-26 DIAGNOSIS — Z1231 Encounter for screening mammogram for malignant neoplasm of breast: Secondary | ICD-10-CM

## 2018-08-27 ENCOUNTER — Other Ambulatory Visit: Payer: Self-pay | Admitting: Nurse Practitioner

## 2018-08-27 DIAGNOSIS — D696 Thrombocytopenia, unspecified: Secondary | ICD-10-CM

## 2018-08-27 LAB — COMPREHENSIVE METABOLIC PANEL
ALT: 18 IU/L (ref 0–32)
AST: 18 IU/L (ref 0–40)
Albumin/Globulin Ratio: 2.1 (ref 1.2–2.2)
Albumin: 4.8 g/dL (ref 3.8–4.9)
Alkaline Phosphatase: 112 IU/L (ref 39–117)
BUN/Creatinine Ratio: 21 (ref 9–23)
BUN: 15 mg/dL (ref 6–24)
Bilirubin Total: 0.4 mg/dL (ref 0.0–1.2)
CO2: 24 mmol/L (ref 20–29)
Calcium: 9.9 mg/dL (ref 8.7–10.2)
Chloride: 102 mmol/L (ref 96–106)
Creatinine, Ser: 0.73 mg/dL (ref 0.57–1.00)
GFR calc Af Amer: 105 mL/min/{1.73_m2} (ref >59)
GFR calc non Af Amer: 91 mL/min/{1.73_m2} (ref >59)
Globulin, Total: 2.3 g/dL (ref 1.5–4.5)
Glucose: 87 mg/dL (ref 65–99)
Potassium: 4.6 mmol/L (ref 3.5–5.2)
Sodium: 142 mmol/L (ref 134–144)
Total Protein: 7.1 g/dL (ref 6.0–8.5)

## 2018-08-27 LAB — CBC WITH DIFFERENTIAL/PLATELET
Basophils Absolute: 0.1 10*3/uL (ref 0.0–0.2)
Basos: 1 %
EOS (ABSOLUTE): 0.5 10*3/uL — ABNORMAL HIGH (ref 0.0–0.4)
Eos: 8 %
Hematocrit: 48.7 % — ABNORMAL HIGH (ref 34.0–46.6)
Hemoglobin: 16 g/dL — ABNORMAL HIGH (ref 11.1–15.9)
Immature Grans (Abs): 0 10*3/uL (ref 0.0–0.1)
Immature Granulocytes: 0 %
Lymphocytes Absolute: 2 10*3/uL (ref 0.7–3.1)
Lymphs: 33 %
MCH: 29.7 pg (ref 26.6–33.0)
MCHC: 32.9 g/dL (ref 31.5–35.7)
MCV: 91 fL (ref 79–97)
Monocytes Absolute: 0.5 10*3/uL (ref 0.1–0.9)
Monocytes: 8 %
Neutrophils Absolute: 3 10*3/uL (ref 1.4–7.0)
Neutrophils: 50 %
Platelets: 136 10*3/uL — ABNORMAL LOW (ref 150–450)
RBC: 5.38 x10E6/uL — ABNORMAL HIGH (ref 3.77–5.28)
RDW: 12.6 % (ref 11.7–15.4)
WBC: 6 10*3/uL (ref 3.4–10.8)

## 2018-08-27 LAB — HEPATITIS C ANTIBODY: Hep C Virus Ab: <0.1 s/co ratio (ref 0.0–0.9)

## 2018-08-27 LAB — LIPID PANEL
Chol/HDL Ratio: 2.9 ratio (ref 0.0–4.4)
Cholesterol, Total: 241 mg/dL — ABNORMAL HIGH (ref 100–199)
HDL: 84 mg/dL (ref >39)
LDL Calculated: 142 mg/dL — ABNORMAL HIGH (ref 0–99)
Triglycerides: 73 mg/dL (ref 0–149)
VLDL Cholesterol Cal: 15 mg/dL (ref 5–40)

## 2018-08-30 ENCOUNTER — Other Ambulatory Visit: Payer: Self-pay

## 2018-09-02 ENCOUNTER — Inpatient Hospital Stay: Payer: Managed Care, Other (non HMO) | Attending: Internal Medicine | Admitting: Internal Medicine

## 2018-09-02 ENCOUNTER — Encounter: Payer: Self-pay | Admitting: Internal Medicine

## 2018-09-02 ENCOUNTER — Other Ambulatory Visit: Payer: Self-pay

## 2018-09-02 DIAGNOSIS — D696 Thrombocytopenia, unspecified: Secondary | ICD-10-CM | POA: Insufficient documentation

## 2018-09-02 NOTE — Progress Notes (Signed)
[place] NOTE  Patient Care Team: Fredderick Severance, NP as PCP - General (Nurse Practitioner)  CHIEF COMPLAINTS/PURPOSE OF CONSULTATION: Thrombocytopenia   HEMATOLOGY HISTORY  # THROMBOCYTOPENIA [platelets- ]; WBC- ; Hb-   HISTORY OF PRESENTING ILLNESS:  Katelyn Ochoa 58 y.o.  female pleasant patient was been referred to [place] for further evaluation of thrombocytopenia.  Patient denies any easy bruising or bleeding.  Denies any abdominal pain weight loss or night sweats or early satiety.  No swelling in the legs.  Patient states that she was told to have low platelets when she was pregnant 26 years ago; evaluated by Dr. Inez Pilgrim.  At that time she was offered splenectomy; blood counts improved of its own.  Pregnancy/postpartum was uncomplicated.  She admits to occasional alcohol during the weekends.  Otherwise no new medications.  Patient is not on any medications.  Denies any fatigue denies any headaches.   Review of Systems  Constitutional: Negative for chills, diaphoresis, fever, malaise/fatigue and weight loss.  HENT: Negative for nosebleeds and sore throat.   Eyes: Negative for double vision.  Respiratory: Negative for cough, hemoptysis, sputum production, shortness of breath and wheezing.   Cardiovascular: Negative for chest pain, palpitations, orthopnea and leg swelling.  Gastrointestinal: Negative for abdominal pain, blood in stool, constipation, diarrhea, heartburn, melena, nausea and vomiting.  Genitourinary: Negative for dysuria, frequency and urgency.  Musculoskeletal: Negative for back pain and joint pain.  Skin: Negative.  Negative for itching and rash.  Neurological: Negative for dizziness, tingling, focal weakness, weakness and headaches.  Endo/Heme/Allergies: Does not bruise/bleed easily.  Psychiatric/Behavioral: Negative for depression. The patient is not nervous/anxious and does not have insomnia.      MEDICAL HISTORY:  Past Medical  History:  Diagnosis Date  . Abnormal Pap smear of cervix maybe 2002-2007   hx of LEEP    SURGICAL HISTORY: History reviewed. No pertinent surgical history.  SOCIAL HISTORY: Social History   Socioeconomic History  . Marital status: Married    Spouse name: Patrick Jupiter  . Number of children: 3  . Years of education: 29  . Highest education level: High school graduate  Occupational History  . Not on file  Social Needs  . Financial resource strain: Not hard at all  . Food insecurity    Worry: Never true    Inability: Never true  . Transportation needs    Medical: No    Non-medical: No  Tobacco Use  . Smoking status: Never Smoker  . Smokeless tobacco: Never Used  Substance and Sexual Activity  . Alcohol use: Yes    Comment: occasional  . Drug use: No  . Sexual activity: Yes    Birth control/protection: Condom  Lifestyle  . Physical activity    Days per week: 5 days    Minutes per session: 30 min  . Stress: Not at all  Relationships  . Social connections    Talks on phone: More than three times a week    Gets together: Twice a week    Attends religious service: 1 to 4 times per year    Active member of club or organization: Yes    Attends meetings of clubs or organizations: Never    Relationship status: Married  . Intimate partner violence    Fear of current or ex partner: No    Emotionally abused: No    Physically abused: No    Forced sexual activity: No  Other Topics Concern  . Not on file  Social History Narrative   occassional alcohol/over week ends; never smoked; in [place]; in [facility].     FAMILY HISTORY: Family History  Problem Relation Age of Onset  . Hyperlipidemia Mother   . Heart disease Father   . COPD Father   . Breast cancer Daughter 31  . Cancer Daughter        breast  . Breast cancer Maternal Aunt   . Colon cancer Neg Hx   . Ovarian cancer Neg Hx     ALLERGIES:  is allergic to penicillins.  MEDICATIONS:  Current Outpatient Medications   Medication Sig Dispense Refill  . Multiple Vitamin (MULTIVITAMIN) tablet Take 1 tablet by mouth daily.    . Vit B6-Vit B12-Omega 3 Acids (VITAMIN B PLUS+ PO) Take 1 tablet by mouth.      No current facility-administered medications for this visit.      Marland Kitchen  PHYSICAL EXAMINATION:   Vitals:   09/02/18 1116 09/02/18 1118  BP: (!) 160/101 (!) 159/101  Pulse: 89 89  Temp: 98.7 F (37.1 C)    Filed Weights   09/02/18 1116  Weight: 174 lb 3.2 oz (79 kg)    Physical Exam  Constitutional: She is oriented to person, place, and time and well-developed, well-nourished, and in no distress.  HENT:  Head: Normocephalic and atraumatic.  Mouth/Throat: Oropharynx is clear and moist. No oropharyngeal exudate.  Eyes: Pupils are equal, round, and reactive to light.  Neck: Normal range of motion. Neck supple.  Cardiovascular: Normal rate and regular rhythm.  Pulmonary/Chest: No respiratory distress. She has no wheezes.  Abdominal: Soft. Bowel sounds are normal. She exhibits no distension and no mass. There is no abdominal tenderness. There is no rebound and no guarding.  Musculoskeletal: Normal range of motion.        General: No tenderness or edema.  Neurological: She is alert and oriented to person, place, and time.  Skin: Skin is warm.  Psychiatric: Affect normal.     LABORATORY DATA:  I have reviewed the data as listed Lab Results  Component Value Date   WBC 6.0 08/26/2018   HGB 16.0 (H) 08/26/2018   HCT 48.7 (H) 08/26/2018   MCV 91 08/26/2018   PLT 136 (L) 08/26/2018   Recent Labs    08/26/18 1327  NA 142  K 4.6  CL 102  CO2 24  GLUCOSE 87  BUN 15  CREATININE 0.73  CALCIUM 9.9  GFRNONAA 91  GFRAA 105  PROT 7.1  ALBUMIN 4.8  AST 18  ALT 18  ALKPHOS 112  BILITOT 0.4     Mm 3d Screen Breast Bilateral  Result Date: 08/26/2018 CLINICAL DATA:  Screening. EXAM: DIGITAL SCREENING BILATERAL MAMMOGRAM WITH TOMO AND CAD COMPARISON:  Previous exam(s). ACR Breast Density  Category b: There are scattered areas of fibroglandular density. FINDINGS: There are no findings suspicious for malignancy. Images were processed with CAD. IMPRESSION: No mammographic evidence of malignancy. A result letter of this screening mammogram will be mailed directly to the patient. RECOMMENDATION: Screening mammogram in one year. (Code:SM-B-01Y) BI-RADS CATEGORY  1: Negative. Electronically Signed   By: Marin Olp M.D.   On: 08/26/2018 16:54    ASSESSMENT & PLAN:   Thrombocytopenia (HCC) #Chronic mild thrombocytopenia platelets are 130s-unclear etiology ITP versus other causes.  Patient is clinically asymptomatic; no obvious splenomegaly/liver disease on exam.  Recommend surveillance at this time.  Patient feels comfortable holding off any further work-up at this time.   # Slightly elevated hb/HCT-etiology is  unclear second versus primary.  Again patient is asymptomatic.;  Recommend holding off at this time however if continues to get worse would recommend Jak 2 mutation/erythropoietin levels etc.   Thank you Ms.Poulose, NP for allowing me to participate in the care of your pleasant patient. Please do not hesitate to contact me with questions or concerns in the interim.  # DISPOSITION: # No labs today # follow up in 6 months- MD/cbc/cmp/LDH- Dr.B  All questions were answered. The patient knows to call the clinic with any problems, questions or concerns.  Thank you Dr. for allowing me to participate in the care of your pleasant patient. Please do not hesitate to contact me with questions or concerns in the interim.   Cammie Sickle, MD 09/02/2018 12:12 PM   # 45 minutes face-to-face with the patient discussing the above plan of care; more than 50% of time spent on counseling and coordination.

## 2018-09-02 NOTE — Assessment & Plan Note (Addendum)
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  Patient is clinically asymptomatic; no obvious splenomegaly/liver disease on exam.  Recommend surveillance at this time.  Patient feels comfortable holding off any further work-up at this time.    Educated patient at length regarding potential signs and symptoms of thrombocytopenia/bleeding; call us sooner if she is symptomatic.  # Slightly elevated hb/HCT-etiology is unclear second versus primary.  Again patient is asymptomatic.;  Recommend holding off at this time however if continues to get worse would recommend Jak 2 mutation/erythropoietin levels etc.   Thank you Ms.Poulose, NP for allowing me to participate in the care of your pleasant patient. Please do not hesitate to contact me with questions or concerns in the interim.  # DISPOSITION: # No labs today # follow up in 6 months- MD/cbc/cmp/LDH- Dr.B

## 2019-03-04 ENCOUNTER — Ambulatory Visit: Payer: Managed Care, Other (non HMO) | Admitting: Internal Medicine

## 2019-03-04 ENCOUNTER — Other Ambulatory Visit: Payer: Managed Care, Other (non HMO)

## 2019-06-30 IMAGING — MG DIGITAL SCREENING BILATERAL MAMMOGRAM WITH TOMO AND CAD
8 series · 8 of 24 positions shown · non-contrast
Comparison: Previous exam(s).

CLINICAL DATA: Screening.

EXAM:
DIGITAL SCREENING BILATERAL MAMMOGRAM WITH TOMO AND CAD

[R MLO synth-2D]
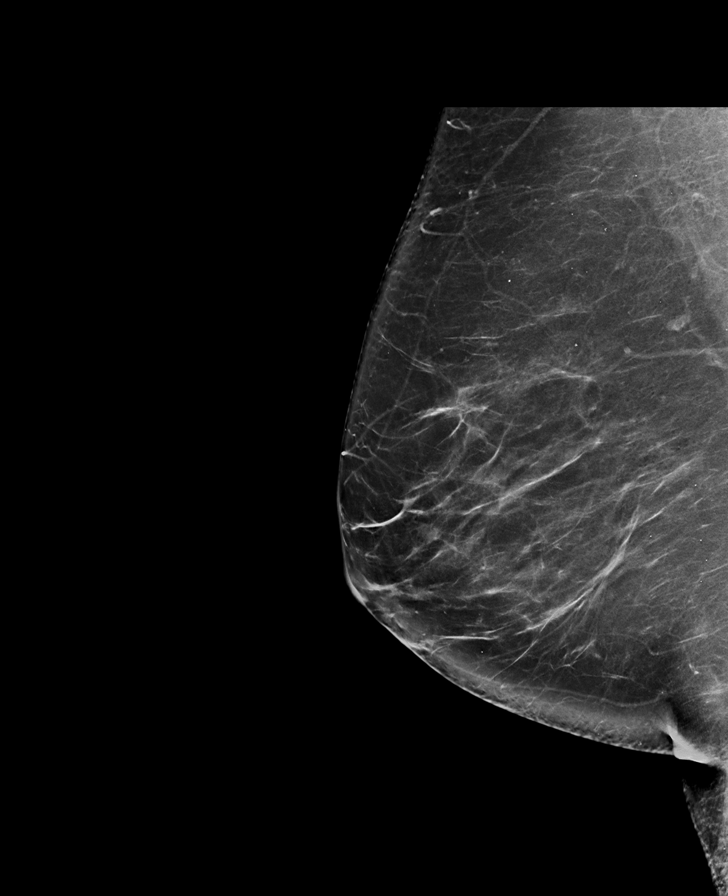

[L CC synth-2D]
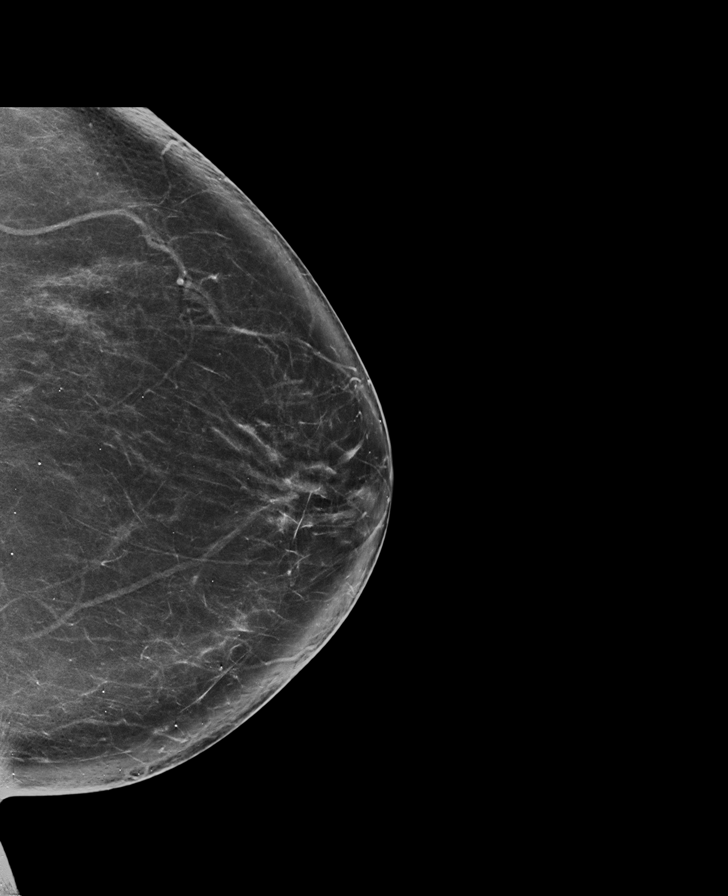

[R CC synth-2D]
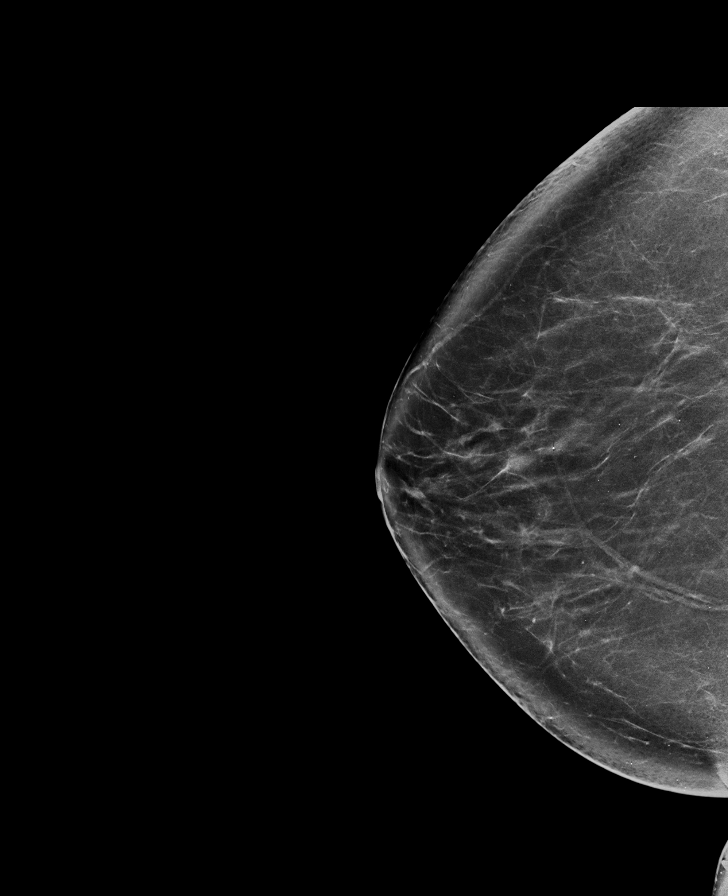

[L MLO synth-2D]
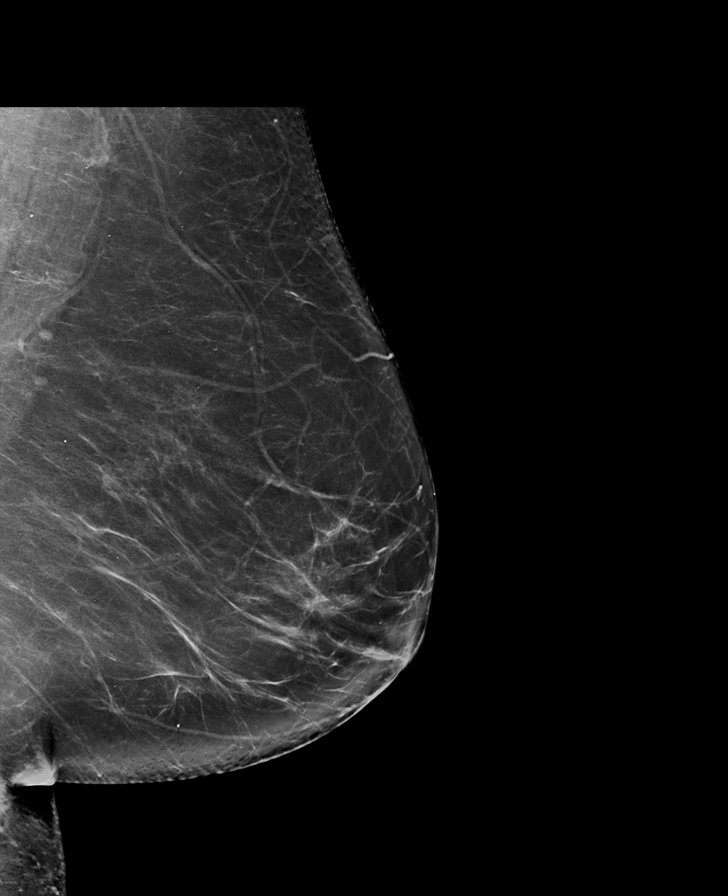

[L CC tomo · tomo slice 45/90.0]
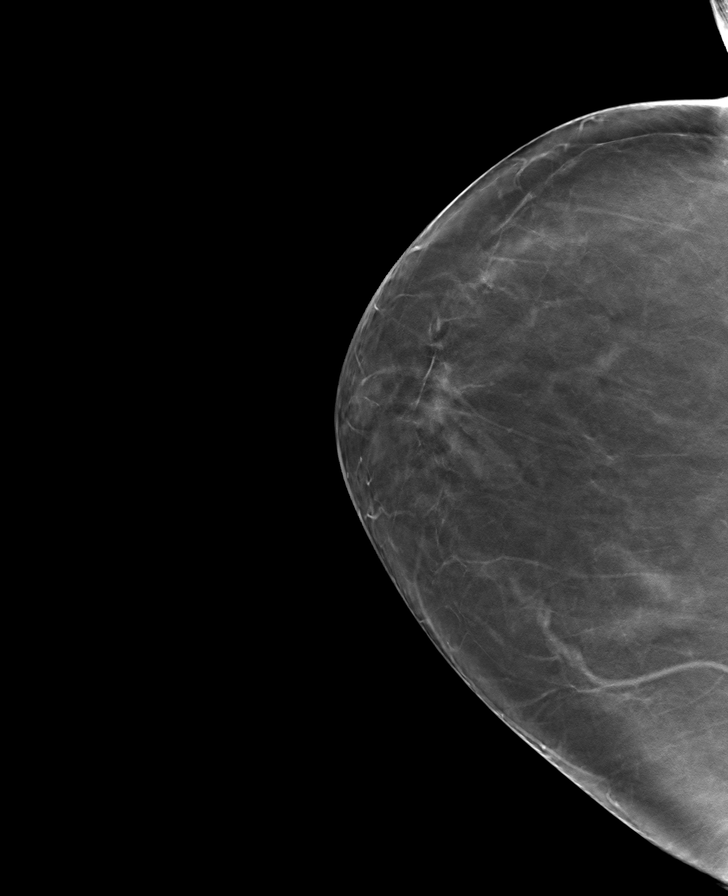

[L MLO tomo · tomo slice 48/95.0]
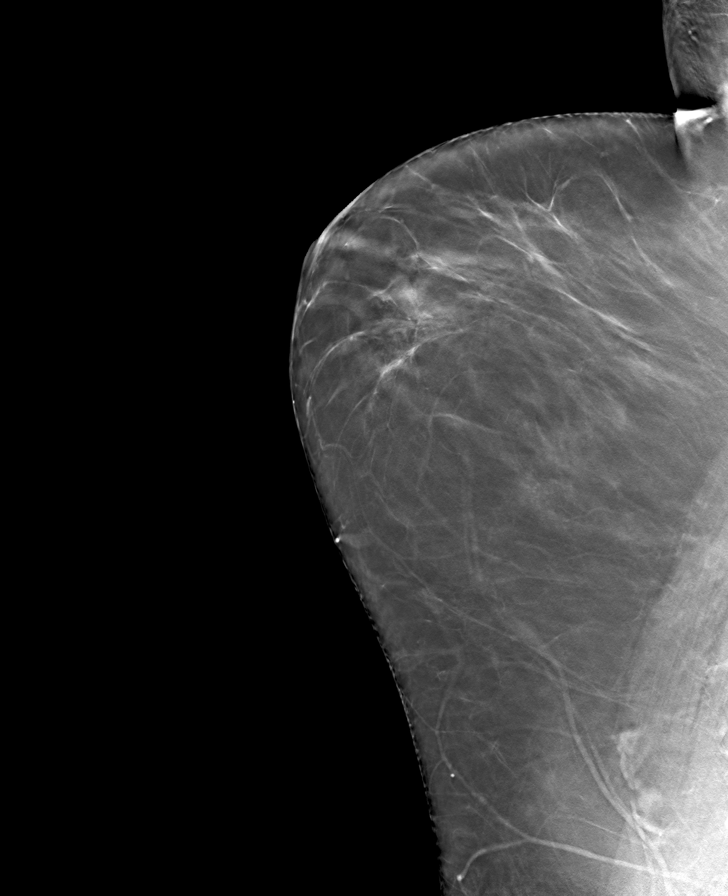

[R MLO tomo · tomo slice 47/92.0]
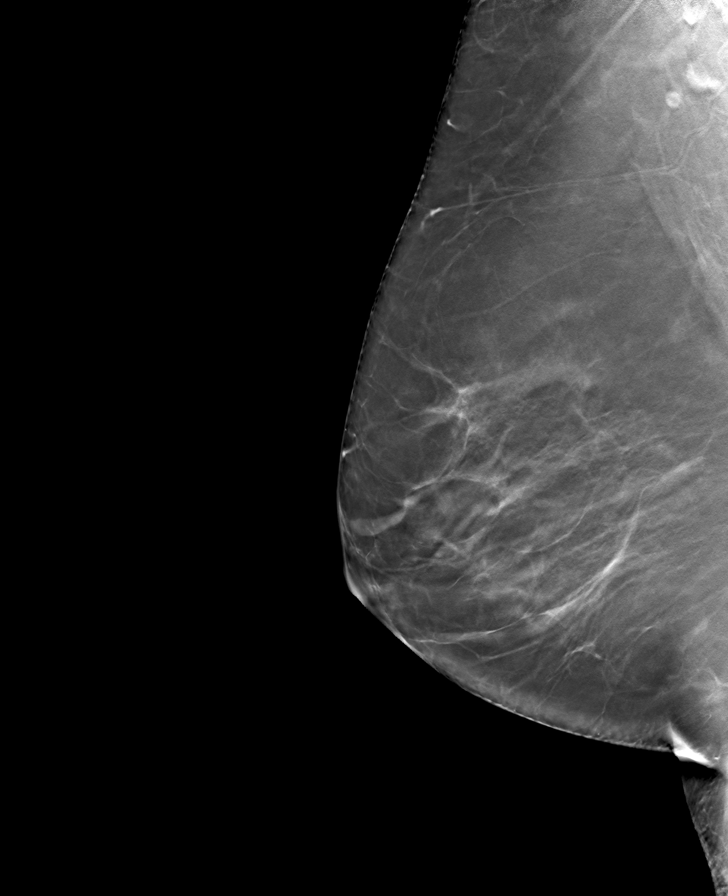

[R CC tomo · tomo slice 47/93.0]
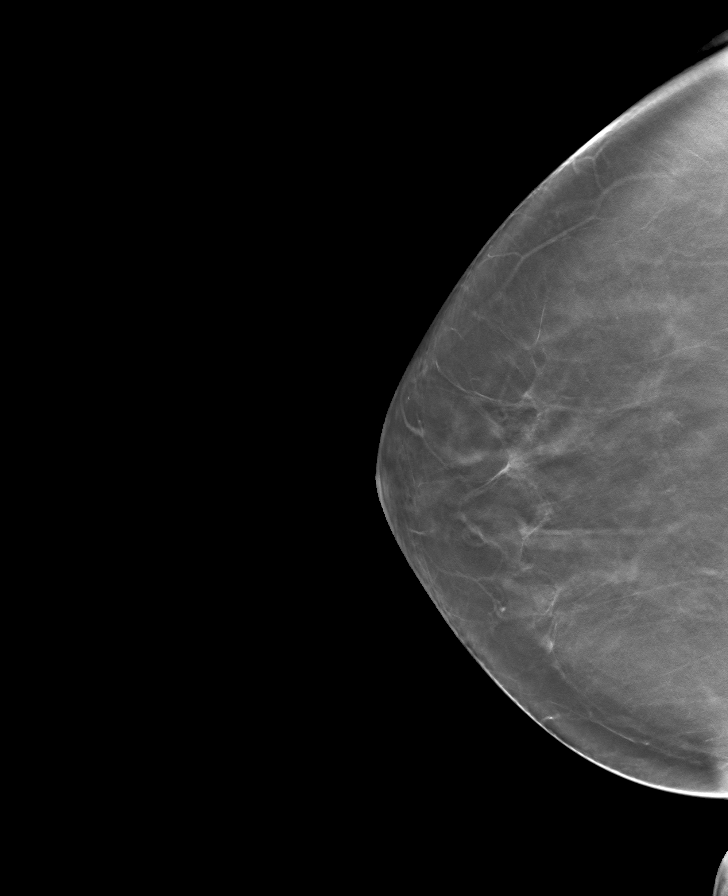

[8 of 24 positions shown; findings below may reference images not displayed]

ACR Breast Density Category b: There are scattered areas of
fibroglandular density.
FINDINGS: There are no findings suspicious for malignancy. Images were
processed with CAD.
IMPRESSION: No mammographic evidence of malignancy. A result letter of this
screening mammogram will be mailed directly to the patient.

RECOMMENDATION:
Screening mammogram in one year. (Code:CN-U-775)

BI-RADS CATEGORY  1: Negative.

## 2019-08-29 ENCOUNTER — Encounter: Payer: Self-pay | Admitting: Family Medicine

## 2019-08-29 ENCOUNTER — Ambulatory Visit (INDEPENDENT_AMBULATORY_CARE_PROVIDER_SITE_OTHER): Payer: Managed Care, Other (non HMO) | Admitting: Family Medicine

## 2019-08-29 ENCOUNTER — Other Ambulatory Visit: Payer: Self-pay

## 2019-08-29 VITALS — BP 122/78 | HR 88 | Temp 97.6°F | Resp 16 | Ht 64.0 in | Wt 174.2 lb

## 2019-08-29 DIAGNOSIS — Z131 Encounter for screening for diabetes mellitus: Secondary | ICD-10-CM

## 2019-08-29 DIAGNOSIS — D696 Thrombocytopenia, unspecified: Secondary | ICD-10-CM

## 2019-08-29 DIAGNOSIS — D582 Other hemoglobinopathies: Secondary | ICD-10-CM

## 2019-08-29 DIAGNOSIS — Z1231 Encounter for screening mammogram for malignant neoplasm of breast: Secondary | ICD-10-CM

## 2019-08-29 DIAGNOSIS — Z0000 Encounter for general adult medical examination without abnormal findings: Secondary | ICD-10-CM

## 2019-08-29 DIAGNOSIS — E785 Hyperlipidemia, unspecified: Secondary | ICD-10-CM | POA: Diagnosis not present

## 2019-08-29 NOTE — Progress Notes (Signed)
Patient: Katelyn Ochoa, Female    DOB: 1960-05-15, 59 y.o.   MRN: [id number] Delsa Grana, PA-C Visit Date: 08/29/2019  Today's Provider: Delsa Grana, PA-C   No chief complaint on file.  Subjective:   Annual physical exam:  Katelyn Ochoa is a 59 y.o. female who presents today for complete physical exam:  Exercise/Activity:  Treadmill and walking  Diet/nutrition:  Sweet tooth, but tries to eat good, drinks a lot of water, mostly chicken or fish, avoids red meats Sleep: sleeps well    USPSTF grade A and B recommendations - reviewed and addressed today  Depression:  Phq 9 completed today by patient, was reviewed by me with patient in the room PHQ score is neg, pt feels good PHQ 2/9 Scores 08/29/2019 07/30/2018 07/27/2017 07/25/2016  PHQ - 2 Score 0 0 0 1  PHQ- 9 Score 0 0 - -   Depression screen [facility] 2/9 08/29/2019 07/30/2018 07/27/2017 07/25/2016 07/22/2015  Decreased Interest 0 0 0 0 0  Down, Depressed, Hopeless 0 0 0 1 0  PHQ - 2 Score 0 0 0 1 0  Altered sleeping 0 0 - - -  Tired, decreased energy 0 0 - - -  Change in appetite 0 0 - - -  Feeling bad or failure about yourself  0 0 - - -  Trouble concentrating 0 0 - - -  Moving slowly or fidgety/restless 0 0 - - -  Suicidal thoughts 0 0 - - -  PHQ-9 Score 0 0 - - -  Difficult doing work/chores Not difficult at all Not difficult at all - - -    Alcohol screening:   Office Visit from 08/29/2019 in [place]  AUDIT-C Score 2     Immunizations and Health Maintenance: Health Maintenance  Topic Date Due  . MAMMOGRAM  08/26/2019  . HIV Screening  08/28/2020 (Originally 04/09/1975)  . INFLUENZA VACCINE  10/05/2019  . Fecal DNA (Cologuard)  08/17/2020  . PAP SMEAR-Modifier  07/30/2021  . TETANUS/TDAP  07/11/2023  . Hepatitis C Screening  Completed     Hep C Screening: done previously  STD testing and prevention (HIV/chl/gon/syphilis): declined  see above, no additional testing desired by pt  today  Intimate partner violence:  safe  Sexual History/Pain during Intercourse: Married, lack of, no problems  Menstrual History/LMP/Abnormal Bleeding:  Last menses ~10 years ago  No LMP recorded. Patient is postmenopausal.  Incontinence Symptoms:  none  Breast cancer:  Last Mammogram: Due - ordered see HM list above BRCA gene screening:  Some negative testing from daughter   Cervical cancer screening: UTD No uterine, ovarian, colon Aunt and first cousin, pts daughter in 2016  Breast CA  Osteoporosis:   Discussion on osteoporosis per age, including high calcium and vitamin D supplementation, weight bearing exercises Multivitamin- no other supplements No past Bone scan/dexa Roughly experienced menopause at age 20 y/o  Skin cancer:  Hx of skin CA -  NO She has seen dermatology before for eval Discussed atypical lesions   Colorectal cancer:   Colonoscopy is  Discussed concerning signs and sx of CRC, pt denies melena, hematochezia, BMs  Lung cancer:   Low Dose CT Chest recommended if Age 80-80 years, 30 pack-year currently smoking OR have quit w/in 15years. Patient does not qualify.    Social History   Tobacco Use  . Smoking status: Never Smoker  . Smokeless tobacco: Never Used  Vaping Use  . Vaping Use: Never used  Substance Use Topics  . Alcohol use: Yes    Comment: occasional  . Drug use: No       [facility] from 08/29/2019 in [facility]  AUDIT-C Score 2      Family History  Problem Relation Age of Onset  . Hyperlipidemia Mother   . Heart disease Father   . COPD Father   . Breast cancer Daughter 79  . Cancer Daughter        breast  . Breast cancer Maternal Aunt   . Colon cancer Neg Hx   . Ovarian cancer Neg Hx      Blood pressure/Hypertension: BP Readings from Last 3 Encounters:  08/29/19 122/78  09/02/18 (!) 159/101  07/30/18 126/80    Weight/Obesity: Wt Readings from Last 3 Encounters:  08/29/19 174 lb 3.2 oz (79 kg)   09/02/18 174 lb 3.2 oz (79 kg)  07/30/18 172 lb 4.8 oz (78.2 kg)   BMI Readings from Last 3 Encounters:  08/29/19 29.90 kg/m  09/02/18 29.90 kg/m  07/30/18 29.58 kg/m     Lipids:  Lab Results  Component Value Date   CHOL 241 (H) 08/26/2018   CHOL 230 (H) 07/27/2017   CHOL 226 (H) 07/25/2016   Lab Results  Component Value Date   HDL 84 08/26/2018   HDL 81 07/27/2017   HDL 79 07/25/2016   Lab Results  Component Value Date   LDLCALC 142 (H) 08/26/2018   LDLCALC 135 (H) 07/27/2017   LDLCALC 130 (H) 07/25/2016   Lab Results  Component Value Date   TRIG 73 08/26/2018   TRIG 72 07/27/2017   TRIG 83 07/25/2016   Lab Results  Component Value Date   CHOLHDL 2.9 08/26/2018   CHOLHDL 2.8 07/27/2017   CHOLHDL 2.9 07/25/2016   No results found for: LDLDIRECT Based on the results of lipid panel his/her cardiovascular risk factor ( using [place] )  in the next 10 years is: The 10-year ASCVD risk score Mikey Bussing DC Brooke Bonito., et al., 2013) is: 2.3%   Values used to calculate the score:     Age: 37 years     Sex: Female     Is Non-Hispanic African American: No     Diabetic: No     Tobacco smoker: No     Systolic Blood Pressure: 811 mmHg     Is BP treated: No     HDL Cholesterol: 84 mg/dL     Total Cholesterol: 241 mg/dL Glucose:  Glucose  Date Value Ref Range Status  08/26/2018 87 65 - 99 mg/dL Final  07/27/2017 84 65 - 99 mg/dL Final  07/29/2015 93 65 - 99 mg/dL Final   Glucose, Bld  Date Value Ref Range Status  07/25/2016 91 65 - 99 mg/dL Final   Hypertension: BP Readings from Last 3 Encounters:  08/29/19 122/78  09/02/18 (!) 159/101  07/30/18 126/80   Obesity: Wt Readings from Last 3 Encounters:  08/29/19 174 lb 3.2 oz (79 kg)  09/02/18 174 lb 3.2 oz (79 kg)  07/30/18 172 lb 4.8 oz (78.2 kg)   BMI Readings from Last 3 Encounters:  08/29/19 29.90 kg/m  09/02/18 29.90 kg/m  07/30/18 29.58 kg/m    Social History      She        Social History    Socioeconomic History  . Marital status: Married    Spouse name: Patrick Jupiter  . Number of children: 3  . Years of education: 37  . Highest  education level: High school graduate  Occupational History  . Not on file  Tobacco Use  . Smoking status: Never Smoker  . Smokeless tobacco: Never Used  Vaping Use  . Vaping Use: Never used  Substance and Sexual Activity  . Alcohol use: Yes    Comment: occasional  . Drug use: No  . Sexual activity: Yes    Birth control/protection: Condom  Other Topics Concern  . Not on file  Social History Narrative   occassional alcohol/over week ends; never smoked; in [place]; in [facility].    Social Determinants of Health   Financial Resource Strain:   . Difficulty of Paying Living Expenses:   Food Insecurity:   . Worried About Charity fundraiser in the Last Year:   . Arboriculturist in the Last Year:   Transportation Needs:   . Film/video editor (Medical):   Marland Kitchen Lack of Transportation (Non-Medical):   Physical Activity:   . Days of Exercise per Week:   . Minutes of Exercise per Session:   Stress:   . Feeling of Stress :   Social Connections:   . Frequency of Communication with Friends and Family:   . Frequency of Social Gatherings with Friends and Family:   . Attends Religious Services:   . Active Member of Clubs or Organizations:   . Attends Archivist Meetings:   Marland Kitchen Marital Status:     Family History        Family History  Problem Relation Age of Onset  . Hyperlipidemia Mother   . Heart disease Father   . COPD Father   . Breast cancer Daughter 30  . Cancer Daughter        breast  . Breast cancer Maternal Aunt   . Colon cancer Neg Hx   . Ovarian cancer Neg Hx     Patient Active Problem List   Diagnosis Date Noted  . Thrombocytopenia ([place]) 09/02/2018  . Elevated hemoglobin ([place]) 07/26/2016  . Colon cancer screening 07/22/2015  . Preventative health care 07/22/2015  . Neoplasm of uncertain behavior of skin of lower  leg 07/22/2015  . Pap smear for cervical cancer screening 07/22/2015    History reviewed. No pertinent surgical history.   Current Outpatient Medications:  Marland Kitchen  Multiple Vitamin (MULTIVITAMIN) tablet, Take 1 tablet by mouth daily., Disp: , Rfl:  .  Vit B6-Vit B12-Omega 3 Acids (VITAMIN B PLUS+ PO), Take 1 tablet by mouth. , Disp: , Rfl:   Allergies  Allergen Reactions  . Penicillins Rash    Patient Care Team: Delsa Grana, PA-C as PCP - General (Family Medicine)  Review of Systems  Constitutional: Negative.  Negative for activity change, appetite change, fatigue and unexpected weight change.  HENT: Negative.   Eyes: Negative.   Respiratory: Negative.  Negative for shortness of breath.   Cardiovascular: Negative.  Negative for chest pain, palpitations and leg swelling.  Gastrointestinal: Negative.  Negative for abdominal pain and blood in stool.  Endocrine: Negative.   Genitourinary: Negative.   Musculoskeletal: Negative.  Negative for arthralgias, gait problem, joint swelling and myalgias.  Skin: Negative.  Negative for color change, pallor and rash.  Allergic/Immunologic: Negative.   Neurological: Negative.  Negative for syncope and weakness.  Hematological: Negative.   Psychiatric/Behavioral: Negative.  Negative for confusion, dysphoric mood, self-injury and suicidal ideas. The patient is not nervous/anxious.   All other systems reviewed and are negative.           Objective:  Vitals:  Vitals:   08/29/19 1414  BP: 122/78  Pulse: 88  Resp: 16  Temp: 97.6 F (36.4 C)  TempSrc: Temporal  SpO2: 97%  Weight: 174 lb 3.2 oz (79 kg)  Height: 5\' 4"  (1.626 m)    Body mass index is 29.9 kg/m.  Physical Exam Vitals and nursing note reviewed.  Constitutional:      General: She is not in acute distress.    Appearance: Normal appearance. She is well-developed. She is not ill-appearing, toxic-appearing or diaphoretic.     Interventions: Face mask in place.  HENT:      Head: Normocephalic and atraumatic.     Right Ear: External ear normal.     Left Ear: External ear normal.  Eyes:     General: Lids are normal. No scleral icterus.       Right eye: No discharge.        Left eye: No discharge.     Conjunctiva/sclera: Conjunctivae normal.  Neck:     Trachea: Phonation normal. No tracheal deviation.  Cardiovascular:     Rate and Rhythm: Normal rate and regular rhythm.     Pulses: Normal pulses.          Radial pulses are 2+ on the right side and 2+ on the left side.       Posterior tibial pulses are 2+ on the right side and 2+ on the left side.     Heart sounds: Normal heart sounds. No murmur heard.  No friction rub. No gallop.   Pulmonary:     Effort: Pulmonary effort is normal. No respiratory distress.     Breath sounds: Normal breath sounds. No stridor. No wheezing, rhonchi or rales.  Chest:     Chest wall: No tenderness.     Breasts: Breasts are symmetrical.        Right: Normal. No swelling, bleeding, inverted nipple, mass, nipple discharge, skin change or tenderness.        Left: Normal. No swelling, bleeding, inverted nipple, mass, nipple discharge, skin change or tenderness.  Abdominal:     General: Bowel sounds are normal. There is no distension.     Palpations: Abdomen is soft.     Tenderness: There is no abdominal tenderness. There is no guarding or rebound.  Musculoskeletal:        General: No deformity. Normal range of motion.     Cervical back: Normal range of motion and neck supple.     Right lower leg: No edema.     Left lower leg: No edema.  Lymphadenopathy:     Cervical: No cervical adenopathy.     Upper Body:     Right upper body: No supraclavicular, axillary or pectoral adenopathy.     Left upper body: No supraclavicular, axillary or pectoral adenopathy.  Skin:    General: Skin is warm and dry.     Capillary Refill: Capillary refill takes less than 2 seconds.     Coloration: Skin is not jaundiced or pale.     Findings: No  rash.  Neurological:     Mental Status: She is alert and oriented to person, place, and time.     Motor: No abnormal muscle tone.     Gait: Gait normal.  Psychiatric:        Speech: Speech normal.        Behavior: Behavior normal.       Fall Risk: Fall Risk  08/29/2019 07/30/2018 07/27/2017 07/25/2016 07/22/2015  Falls in  the past year? 0 0 No Yes No  Number falls in past yr: 0 - - 1 -  Injury with Fall? 0 - - No -    Functional Status Survey: Is the patient deaf or have difficulty hearing?: No Does the patient have difficulty seeing, even when wearing glasses/contacts?: No Does the patient have difficulty concentrating, remembering, or making decisions?: No Does the patient have difficulty walking or climbing stairs?: No Does the patient have difficulty dressing or bathing?: No Does the patient have difficulty doing errands alone such as visiting a doctor's office or shopping?: No   Assessment & Plan:    CPE completed today  . USPSTF grade A and B recommendations reviewed with patient; age-appropriate recommendations, preventive care, screening tests, etc discussed and encouraged; healthy living encouraged; see AVS for patient education given to patient  . Discussed importance of 150 minutes of physical activity weekly, AHA exercise recommendations given to pt in AVS/handout  . Discussed importance of healthy diet:  eating lean meats and proteins, avoiding trans fats and saturated fats, avoid simple sugars and excessive carbs in diet, eat 6 servings of fruit/vegetables daily and drink plenty of water and avoid sweet beverages.    . Recommended pt to do annual eye exam and routine dental exams/cleanings  . Depression, alcohol, fall screening completed as documented above and per flowsheets  . Reviewed Health Maintenance: Health Maintenance  Topic Date Due  . MAMMOGRAM  08/26/2019  . HIV Screening  08/28/2020 (Originally 04/[birth year]/1975)  . INFLUENZA VACCINE  10/05/2019  . Fecal  DNA (Cologuard)  08/17/2020  . PAP SMEAR-Modifier  07/30/2021  . TETANUS/TDAP  07/11/2023  . Hepatitis C Screening  Completed    . Immunizations: Immunization History  Administered Date(s) Administered  . MMR 08/27/2017  . Tdap 07/10/2013    Orders Placed This Encounter  Procedures  . MM 3D SCREEN BREAST BILATERAL    Standing Status:   Future    Standing Expiration Date:   08/04/2020    Order Specific Question:   Reason for Exam (SYMPTOM  OR DIAGNOSIS REQUIRED)    Answer:   Breast cancer screening    Order Specific Question:   Is the patient pregnant?    Answer:   No    Order Specific Question:   Preferred imaging location?    Answer:   [facility]      1. Adult general medical exam - Lipid panel - CBC w/Diff/Platelet - Comprehensive metabolic panel - Hemoglobin A1c  2. Encounter for screening mammogram for malignant neoplasm of breast Breast exam done - MM 3D SCREEN BREAST BILATERAL; Future  3. Elevated lipids Low risk currently, reviewed ASCVD, encouraged continue diet and lifestyle efforts, will continue to monitor and reassess ASCVD risk The 10-year ASCVD risk score Mikey Bussing DC Brooke Bonito., et al., 2013) is: 2.3%   Values used to calculate the score:     Age: 84 years     Sex: Female     Is Non-Hispanic African American: No     Diabetic: No     Tobacco smoker: No     Systolic Blood Pressure: 914 mmHg     Is BP treated: No     HDL Cholesterol: 84 mg/dL     Total Cholesterol: 241 mg/dL  - Lipid panel - Comprehensive metabolic panel  4. Thrombocytopenia ([place]) recheck - CBC w/Diff/Platelet  5. Screening for diabetes mellitus - Hemoglobin A1c  6. Elevated hemoglobin (H[facility]) Elevated many times, non-smoker no suspected sleep apnea  -  CBC w/Diff/Platelet  She was previously referred to hematology for eval, Dr. B - she doesn't not want referral today, wants to wait for labs, H/H mildly elevated, fairly consistently abnormal labs Mildly low platelets also appear  stable   Delsa Grana, PA-C 08/29/19 2:48 PM  [place]

## 2019-08-29 NOTE — Patient Instructions (Signed)
Preventive Care 40-59 Years Old, Female Preventive care refers to visits with your health care provider and lifestyle choices that can promote health and wellness. This includes:  A yearly physical exam. This may also be called an annual well check.  Regular dental visits and eye exams.  Immunizations.  Screening for certain conditions.  Healthy lifestyle choices, such as eating a healthy diet, getting regular exercise, not using drugs or products that contain nicotine and tobacco, and limiting alcohol use. What can I expect for my preventive care visit? Physical exam Your health care provider will check your:  Height and weight. This may be used to calculate body mass index (BMI), which tells if you are at a healthy weight.  Heart rate and blood pressure.  Skin for abnormal spots. Counseling Your health care provider may ask you questions about your:  Alcohol, tobacco, and drug use.  Emotional well-being.  Home and relationship well-being.  Sexual activity.  Eating habits.  Work and work environment.  Method of birth control.  Menstrual cycle.  Pregnancy history. What immunizations do I need?  Influenza (flu) vaccine  This is recommended every year. Tetanus, diphtheria, and pertussis (Tdap) vaccine  You may need a Td booster every 10 years. Varicella (chickenpox) vaccine  You may need this if you have not been vaccinated. Zoster (shingles) vaccine  You may need this after age 60. Measles, mumps, and rubella (MMR) vaccine  You may need at least one dose of MMR if you were born in 1957 or later. You may also need a second dose. Pneumococcal conjugate (PCV13) vaccine  You may need this if you have certain conditions and were not previously vaccinated. Pneumococcal polysaccharide (PPSV23) vaccine  You may need one or two doses if you smoke cigarettes or if you have certain conditions. Meningococcal conjugate (MenACWY) vaccine  You may need this if you  have certain conditions. Hepatitis A vaccine  You may need this if you have certain conditions or if you travel or work in places where you may be exposed to hepatitis A. Hepatitis B vaccine  You may need this if you have certain conditions or if you travel or work in places where you may be exposed to hepatitis B. Haemophilus influenzae type b (Hib) vaccine  You may need this if you have certain conditions. Human papillomavirus (HPV) vaccine  If recommended by your health care provider, you may need three doses over 6 months. You may receive vaccines as individual doses or as more than one vaccine together in one shot (combination vaccines). Talk with your health care provider about the risks and benefits of combination vaccines. What tests do I need? Blood tests  Lipid and cholesterol levels. These may be checked every 5 years, or more frequently if you are over 50 years old.  Hepatitis C test.  Hepatitis B test. Screening  Lung cancer screening. You may have this screening every year starting at age 55 if you have a 30-pack-year history of smoking and currently smoke or have quit within the past 15 years.  Colorectal cancer screening. All adults should have this screening starting at age 50 and continuing until age 75. Your health care provider may recommend screening at age 45 if you are at increased risk. You will have tests every 1-10 years, depending on your results and the type of screening test.  Diabetes screening. This is done by checking your blood sugar (glucose) after you have not eaten for a while (fasting). You may have this   done every 1-3 years.  Mammogram. This may be done every 1-2 years. Talk with your health care provider about when you should start having regular mammograms. This may depend on whether you have a family history of breast cancer.  BRCA-related cancer screening. This may be done if you have a family history of breast, ovarian, tubal, or peritoneal  cancers.  Pelvic exam and Pap test. This may be done every 3 years starting at age 21. Starting at age 30, this may be done every 5 years if you have a Pap test in combination with an HPV test. Other tests  Sexually transmitted disease (STD) testing.  Bone density scan. This is done to screen for osteoporosis. You may have this scan if you are at high risk for osteoporosis. Follow these instructions at home: Eating and drinking  Eat a diet that includes fresh fruits and vegetables, whole grains, lean protein, and low-fat dairy.  Take vitamin and mineral supplements as recommended by your health care provider.  Do not drink alcohol if: ? Your health care provider tells you not to drink. ? You are pregnant, may be pregnant, or are planning to become pregnant.  If you drink alcohol: ? Limit how much you have to 0-1 drink a day. ? Be aware of how much alcohol is in your drink. In the [place] one drink equals one 12 oz bottle of beer (355 mL), one 5 oz glass of wine (148 mL), or one 1 oz glass of hard liquor (44 mL). Lifestyle  Take daily care of your teeth and gums.  Stay active. Exercise for at least 30 minutes on 5 or more days each week.  Do not use any products that contain nicotine or tobacco, such as cigarettes, e-cigarettes, and chewing tobacco. If you need help quitting, ask your health care provider.  If you are sexually active, practice safe sex. Use a condom or other form of birth control (contraception) in order to prevent pregnancy and STIs (sexually transmitted infections).  If told by your health care provider, take low-dose aspirin daily starting at age 50. What's next?  Visit your health care provider once a year for a well check visit.  Ask your health care provider how often you should have your eyes and teeth checked.  Stay up to date on all vaccines. This information is not intended to replace advice given to you by your health care provider. Make sure you  discuss any questions you have with your health care provider. Document Revised: 11/01/2017 Document Reviewed: 11/01/2017 Elsevier Patient Education  2020 Elsevier Inc.   Preventing Osteoporosis, Adult Osteoporosis is a condition that causes the bones to lose density. This means that the bones become thinner, and the normal spaces in bone tissue become larger. Low bone density can make the bones weak and cause them to break more easily. Osteoporosis cannot always be prevented, but you can take steps to lower your risk of developing this condition. How can this condition affect me? If you develop osteoporosis, you will be more likely to break bones in your wrist, spine, or hip. Even a minor accident or injury can be enough to break weak bones. The bones will also be slower to heal. Osteoporosis can cause other problems as well, such as a stooped posture or trouble with movement. Osteoporosis can occur with aging. As you get older, you may lose bone tissue more quickly, or it may be replaced more slowly. Osteoporosis is more likely to develop if you have   poor nutrition or do not get enough calcium or vitamin D. Other lifestyle factors can also play a role. By eating a well-balanced diet and making lifestyle changes, you can help keep your bones strong and healthy, lowering your chances of developing osteoporosis. What can increase my risk? The following factors may make you more likely to develop osteoporosis:  Having a family history of the condition.  Having poor nutrition or not getting enough calcium or vitamin D.  Using certain medicines, such as steroid medicines or antiseizure medicines.  Being any of the following: ? 50 years of age or older. ? Female. ? A woman who has gone through menopause (is postmenopausal). ? White (Caucasian) or of Asian descent.  Smoking or having a history of smoking.  Not being physically active (being sedentary).  Having a small body frame. What  actions can I take to prevent this?  Get enough calcium   Make sure you get enough calcium every day. Calcium is the most important mineral for bone health. Most people can get enough calcium from their diet, but supplements may be recommended for people who are at risk for osteoporosis. Follow these guidelines: ? If you are age 50 or younger, aim to get 1,000 mg of calcium every day. ? If you are older than age 50, aim to get 1,200 mg of calcium every day.  Good sources of calcium include: ? Dairy products, such as low-fat or nonfat milk, cheese, and yogurt. ? Dark green leafy vegetables, such as bok choy and broccoli. ? Foods that have had calcium added to them (calcium-fortified foods), such as orange juice, cereal, bread, soy beverages, and tofu products. ? Nuts, such as almonds.  Check nutrition labels to see how much calcium is in a food or drink. Get enough vitamin D  Try to get enough vitamin D every day. Vitamin D is the most essential vitamin for bone health. It helps the body absorb calcium. Follow these guidelines for how much vitamin D to get from food: ? If you are age 70 or younger, aim to get at least 600 international units (IU) every day. Your health care provider may suggest more. ? If you are older than age 70, aim to get at least 800 international units every day. Your health care provider may suggest more.  Good sources of vitamin D in your diet include: ? Egg yolks. ? Oily fish, such as salmon, sardines, and tuna. ? Milk and cereal fortified with vitamin D.  Your body also makes vitamin D when you are out in the sun. Exposing the bare skin on your face, arms, legs, or back to the sun for no more than 30 minutes a day, 2 times a week is more than enough. Beyond that, make sure you use sunblock to protect your skin from sunburn, which increases your risk for skin cancer. Exercise  Stay active and get exercise every day.  Ask your health care provider what types of  exercise are best for you. Weight-bearing and strength-building activities are important for building and maintaining healthy bones. Some examples of these types of activities include: ? Walking and hiking. ? Jogging and running. ? Dancing. ? Gym exercises. ? Lifting weights. ? Tennis and racquetball. ? Climbing stairs. ? Aerobics. Make other lifestyle changes  Do not use any products that contain nicotine or tobacco, such as cigarettes, e-cigarettes, and chewing tobacco. If you need help quitting, ask your health care provider.  Lose weight if you are overweight.    If you drink alcohol: ? Limit how much you use to:  0-1 drink a day for nonpregnant women.  0-2 drinks a day for men. ? Be aware of how much alcohol is in your drink. In the [place] one drink equals one 12 oz bottle of beer (355 mL), one 5 oz glass of wine (148 mL), or one 1 oz glass of hard liquor (44 mL). Where to find support If you need help making changes to prevent osteoporosis, talk with your health care provider. You can ask for a referral to a diet and nutrition specialist (dietitian) and a physical therapist. Where to find more information Learn more about osteoporosis from:  NIH Osteoporosis and Related Bone Diseases National Resource Center: www.bones.nih.gov  [place] Office on Women's Health: www.womenshealth.gov  National Osteoporosis Foundation: www.nof.org Summary  Osteoporosis is a condition that causes weak bones that are more likely to break.  Eat a healthy diet, making sure you get enough calcium and vitamin D, and stay active by getting regular exercise to help prevent osteoporosis.  Other ways to reduce your risk of osteoporosis include maintaining a healthy weight and avoiding alcohol and products that contain nicotine or tobacco. This information is not intended to replace advice given to you by your health care provider. Make sure you discuss any questions you have with your health care  provider. Document Revised: 09/20/2018 Document Reviewed: 09/20/2018 Elsevier Patient Education  2020 Elsevier Inc.  

## 2019-09-05 ENCOUNTER — Ambulatory Visit
Admission: RE | Admit: 2019-09-05 | Discharge: 2019-09-05 | Disposition: A | Discharge status: Home / Self Care | Payer: Managed Care, Other (non HMO) | Source: Ambulatory Visit | Attending: Family Medicine | Admitting: Family Medicine

## 2019-09-05 DIAGNOSIS — Z1231 Encounter for screening mammogram for malignant neoplasm of breast: Secondary | ICD-10-CM

## 2019-09-06 LAB — CBC WITH DIFFERENTIAL/PLATELET
Basophils Absolute: 0.1 10*3/uL (ref 0.0–0.2)
Basos: 1 %
EOS (ABSOLUTE): 0.2 10*3/uL (ref 0.0–0.4)
Eos: 4 %
Hematocrit: 49 % — ABNORMAL HIGH (ref 34.0–46.6)
Hemoglobin: 16 g/dL — ABNORMAL HIGH (ref 11.1–15.9)
Immature Grans (Abs): 0 10*3/uL (ref 0.0–0.1)
Immature Granulocytes: 0 %
Lymphocytes Absolute: 2.2 10*3/uL (ref 0.7–3.1)
Lymphs: 35 %
MCH: 29.9 pg (ref 26.6–33.0)
MCHC: 32.7 g/dL (ref 31.5–35.7)
MCV: 92 fL (ref 79–97)
Monocytes Absolute: 0.6 10*3/uL (ref 0.1–0.9)
Monocytes: 9 %
Neutrophils Absolute: 3.1 10*3/uL (ref 1.4–7.0)
Neutrophils: 51 %
Platelets: 144 10*3/uL — ABNORMAL LOW (ref 150–450)
RBC: 5.35 x10E6/uL — ABNORMAL HIGH (ref 3.77–5.28)
RDW: 12.7 % (ref 11.7–15.4)
WBC: 6.2 10*3/uL (ref 3.4–10.8)

## 2019-09-06 LAB — COMPREHENSIVE METABOLIC PANEL
ALT: 17 IU/L (ref 0–32)
AST: 17 IU/L (ref 0–40)
Albumin/Globulin Ratio: 2 (ref 1.2–2.2)
Albumin: 4.6 g/dL (ref 3.8–4.9)
Alkaline Phosphatase: 96 IU/L (ref 48–121)
BUN/Creatinine Ratio: 21 (ref 9–23)
BUN: 15 mg/dL (ref 6–24)
Bilirubin Total: 0.4 mg/dL (ref 0.0–1.2)
CO2: 26 mmol/L (ref 20–29)
Calcium: 10 mg/dL (ref 8.7–10.2)
Chloride: 103 mmol/L (ref 96–106)
Creatinine, Ser: 0.72 mg/dL (ref 0.57–1.00)
GFR calc Af Amer: 106 mL/min/{1.73_m2} (ref >59)
GFR calc non Af Amer: 92 mL/min/{1.73_m2} (ref >59)
Globulin, Total: 2.3 g/dL (ref 1.5–4.5)
Glucose: 79 mg/dL (ref 65–99)
Potassium: 4.5 mmol/L (ref 3.5–5.2)
Sodium: 142 mmol/L (ref 134–144)
Total Protein: 6.9 g/dL (ref 6.0–8.5)

## 2019-09-06 LAB — LIPID PANEL
Chol/HDL Ratio: 3.2 ratio (ref 0.0–4.4)
Cholesterol, Total: 225 mg/dL — ABNORMAL HIGH (ref 100–199)
HDL: 70 mg/dL (ref >39)
LDL Chol Calc (NIH): 138 mg/dL — ABNORMAL HIGH (ref 0–99)
Triglycerides: 97 mg/dL (ref 0–149)
VLDL Cholesterol Cal: 17 mg/dL (ref 5–40)

## 2019-09-06 LAB — HEMOGLOBIN A1C
Est. average glucose Bld gHb Est-mCnc: 111 mg/dL
Hgb A1c MFr Bld: 5.5 % (ref 4.8–5.6)

## 2020-09-03 ENCOUNTER — Encounter: Payer: Managed Care, Other (non HMO) | Admitting: Family Medicine

## 2020-09-09 ENCOUNTER — Other Ambulatory Visit: Payer: Self-pay

## 2020-09-09 ENCOUNTER — Encounter: Payer: Self-pay | Admitting: Unknown Physician Specialty

## 2020-09-09 ENCOUNTER — Ambulatory Visit (INDEPENDENT_AMBULATORY_CARE_PROVIDER_SITE_OTHER): Payer: Managed Care, Other (non HMO) | Admitting: Unknown Physician Specialty

## 2020-09-09 ENCOUNTER — Other Ambulatory Visit: Payer: Self-pay | Admitting: Unknown Physician Specialty

## 2020-09-09 VITALS — BP 118/76 | HR 88 | Temp 98.0°F | Resp 16 | Ht 64.0 in | Wt 173.5 lb

## 2020-09-09 DIAGNOSIS — Z0000 Encounter for general adult medical examination without abnormal findings: Secondary | ICD-10-CM | POA: Diagnosis not present

## 2020-09-09 NOTE — Progress Notes (Signed)
BP 118/76   Pulse 88   Temp 98 F (36.7 C) (Oral)   Resp 16   Ht 5\' 4"  (1.626 m)   Wt 173 lb 8 oz (78.7 kg)   SpO2 97%   BMI 29.78 kg/m    Subjective:    Patient ID: Katelyn Ochoa, female    DOB: 31-Mar-1960, 60 y.o.   MRN: [id number]  HPI: Katelyn Ochoa is a 60 y.o. female  Chief Complaint  Patient presents with   Annual Exam   Pt is here for general history and physical  The 10-year ASCVD risk score Mikey Bussing DC Brooke Bonito., et al., 2013) is: 2.6%   Values used to calculate the score:     Age: 33 years     Sex: Female     Is Non-Hispanic African American: No     Diabetic: No     Tobacco smoker: No     Systolic Blood Pressure: 462 mmHg     Is BP treated: No     HDL Cholesterol: 70 mg/dL     Total Cholesterol: 225 mg/dL   Social History   Socioeconomic History   Marital status: Married    Spouse name: Patrick Jupiter   Number of children: 3   Years of education: 12   Highest education level: High school graduate  Occupational History   Not on file  Tobacco Use   Smoking status: Never   Smokeless tobacco: Never  Vaping Use   Vaping Use: Never used  Substance and Sexual Activity   Alcohol use: Yes    Comment: occasional   Drug use: No   Sexual activity: Yes    Birth control/protection: Condom  Other Topics Concern   Not on file  Social History Narrative   occassional alcohol/over week ends; never smoked; in [place]; in [facility].    Social Determinants of Health   Financial Resource Strain: Low Risk    Difficulty of Paying Living Expenses: Not hard at all  Food Insecurity: No Food Insecurity   Worried About Charity fundraiser in the Last Year: Never true   [place] in the Last Year: Never true  Transportation Needs: No Transportation Needs   Lack of Transportation (Medical): No   Lack of Transportation (Non-Medical): No  Physical Activity: Inactive   Days of Exercise per Week: 0 days   Minutes of Exercise per Session: 0 min  Stress: Stress Concern Present    Feeling of Stress : Rather much  Social Connections: Socially Integrated   Frequency of Communication with Friends and Family: More than three times a week   Frequency of Social Gatherings with Friends and Family: More than three times a week   Attends Religious Services: 1 to 4 times per year   Active Member of [employer] or Organizations: Yes   Attends Music therapist: More than 4 times per year   Marital Status: Married  Human resources officer Violence: Not At Risk   Fear of Current or Ex-Partner: No   Emotionally Abused: No   Physically Abused: No   Sexually Abused: No   Family History  Problem Relation Age of Onset   Hyperlipidemia Mother    Heart disease Father    COPD Father    Breast cancer Daughter 61   Cancer Daughter        breast   Breast cancer Maternal Aunt    Colon cancer Neg Hx    Ovarian cancer Neg Hx    Past  Medical History:  Diagnosis Date   Abnormal Pap smear of cervix maybe 2002-2007   hx of LEEP   No past surgical history on file.    Relevant past medical, surgical, family and social history reviewed and updated as indicated. Interim medical history since our last visit reviewed. Allergies and medications reviewed and updated.  Review of Systems  Constitutional: Negative.   HENT: Negative.    Eyes: Negative.   Respiratory: Negative.    Cardiovascular: Negative.   Gastrointestinal: Negative.   Endocrine: Negative.   Genitourinary: Negative.   Musculoskeletal: Negative.   Skin: Negative.   Allergic/Immunologic: Negative.   Neurological: Negative.   Hematological: Negative.   Psychiatric/Behavioral: Negative.     Per HPI unless specifically indicated above     Objective:    BP 118/76   Pulse 88   Temp 98 F (36.7 C) (Oral)   Resp 16   Ht 5\' 4"  (1.626 m)   Wt 173 lb 8 oz (78.7 kg)   SpO2 97%   BMI 29.78 kg/m   Wt Readings from Last 3 Encounters:  09/09/20 173 lb 8 oz (78.7 kg)  08/29/19 174 lb 3.2 oz (79 kg)  09/02/18 174 lb  3.2 oz (79 kg)    Physical Exam Constitutional:      Appearance: She is well-developed.  HENT:     Head: Normocephalic and atraumatic.  Eyes:     General: No scleral icterus.       Right eye: No discharge.        Left eye: No discharge.     Pupils: Pupils are equal, round, and reactive to light.  Neck:     Thyroid: No thyromegaly.     Vascular: No carotid bruit.  Cardiovascular:     Rate and Rhythm: Normal rate and regular rhythm.     Heart sounds: Normal heart sounds. No murmur heard.   No friction rub. No gallop.  Pulmonary:     Effort: Pulmonary effort is normal. No respiratory distress.     Breath sounds: Normal breath sounds. No wheezing or rales.  Abdominal:     General: Bowel sounds are normal.     Palpations: Abdomen is soft.     Tenderness: There is no abdominal tenderness. There is no rebound.  Genitourinary:    Vagina: Normal.     Cervix: No cervical motion tenderness, discharge or friability.     Adnexa:        Right: No mass, tenderness or fullness.         Left: No mass, tenderness or fullness.    Musculoskeletal:        General: Normal range of motion.     Cervical back: Normal range of motion and neck supple.  Lymphadenopathy:     Cervical: No cervical adenopathy.  Skin:    General: Skin is warm and dry.     Findings: No rash.  Neurological:     Mental Status: She is alert and oriented to person, place, and time.  Psychiatric:        Speech: Speech normal.        Behavior: Behavior normal.        Thought Content: Thought content normal.        Judgment: Judgment normal.    Results for orders placed or performed in visit on 08/29/19  Lipid panel  Result Value Ref Range   Cholesterol, Total 225 (H) 100 - 199 mg/dL   Triglycerides 97 0 - 149 mg/dL  HDL 70 >39 mg/dL   VLDL Cholesterol Cal 17 5 - 40 mg/dL   LDL Chol Calc (NIH) 138 (H) 0 - 99 mg/dL   Chol/HDL Ratio 3.2 0.0 - 4.4 ratio  CBC w/Diff/Platelet  Result Value Ref Range   WBC 6.2 3.4 -  10.8 x10E3/uL   RBC 5.35 (H) 3.77 - 5.28 x10E6/uL   Hemoglobin 16.0 (H) 11.1 - 15.9 g/dL   Hematocrit 49.0 (H) 34.0 - 46.6 %   MCV 92 79 - 97 fL   MCH 29.9 26.6 - 33.0 pg   MCHC 32.7 31.5 - 35.7 g/dL   RDW 12.7 11.7 - 15.4 %   Platelets 144 (L) 150 - 450 x10E3/uL   Neutrophils 51 Not Estab. %   Lymphs 35 Not Estab. %   Monocytes 9 Not Estab. %   Eos 4 Not Estab. %   Basos 1 Not Estab. %   Neutrophils Absolute 3.1 1.4 - 7.0 x10E3/uL   Lymphocytes Absolute 2.2 0.7 - 3.1 x10E3/uL   Monocytes Absolute 0.6 0.1 - 0.9 x10E3/uL   EOS (ABSOLUTE) 0.2 0.0 - 0.4 x10E3/uL   Basophils Absolute 0.1 0.0 - 0.2 x10E3/uL   Immature Granulocytes 0 Not Estab. %   Immature Grans (Abs) 0.0 0.0 - 0.1 x10E3/uL  Comprehensive metabolic panel  Result Value Ref Range   Glucose 79 65 - 99 mg/dL   BUN 15 6 - 24 mg/dL   Creatinine, Ser 0.72 0.57 - 1.00 mg/dL   GFR calc non Af Amer 92 >59 mL/min/1.73   GFR calc Af Amer 106 >59 mL/min/1.73   BUN/Creatinine Ratio 21 9 - 23   Sodium 142 134 - 144 mmol/L   Potassium 4.5 3.5 - 5.2 mmol/L   Chloride 103 96 - 106 mmol/L   CO2 26 20 - 29 mmol/L   Calcium 10.0 8.7 - 10.2 mg/dL   Total Protein 6.9 6.0 - 8.5 g/dL   Albumin 4.6 3.8 - 4.9 g/dL   Globulin, Total 2.3 1.5 - 4.5 g/dL   Albumin/Globulin Ratio 2.0 1.2 - 2.2   Bilirubin Total 0.4 0.0 - 1.2 mg/dL   Alkaline Phosphatase 96 48 - 121 IU/L   AST 17 0 - 40 IU/L   ALT 17 0 - 32 IU/L  Hemoglobin A1c  Result Value Ref Range   Hgb A1c MFr Bld 5.5 4.8 - 5.6 %   Est. average glucose Bld gHb Est-mCnc 111 mg/dL      Assessment & Plan:   Problem List Items Addressed This Visit   None Visit Diagnoses     Routine general medical examination at a health care facility    -  Primary   Relevant Orders   MM Digital Screening   Cologuard   CBC with Differential/Platelet   Comprehensive metabolic panel   HIV Antibody (routine testing w rflx)   Lipid Panel w/o Chol/HDL Ratio   TSH   VITAMIN D 25 Hydroxy (Vit-D  Deficiency, Fractures)        Refusing: Shingles vaccine STD check  Update: Mammogram Cologuard HIV screening  Follow up plan: Return if symptoms worsen or fail to improve.

## 2020-09-10 ENCOUNTER — Other Ambulatory Visit: Payer: Self-pay | Admitting: Unknown Physician Specialty

## 2020-09-10 DIAGNOSIS — Z0000 Encounter for general adult medical examination without abnormal findings: Secondary | ICD-10-CM

## 2020-09-10 LAB — COMPREHENSIVE METABOLIC PANEL
ALT: 26 IU/L (ref 0–32)
AST: 25 IU/L (ref 0–40)
Albumin/Globulin Ratio: 2 (ref 1.2–2.2)
Albumin: 4.8 g/dL (ref 3.8–4.9)
Alkaline Phosphatase: 104 IU/L (ref 44–121)
BUN/Creatinine Ratio: 21 (ref 12–28)
BUN: 14 mg/dL (ref 8–27)
Bilirubin Total: 0.5 mg/dL (ref 0.0–1.2)
CO2: 23 mmol/L (ref 20–29)
Calcium: 9.8 mg/dL (ref 8.7–10.3)
Chloride: 100 mmol/L (ref 96–106)
Creatinine, Ser: 0.68 mg/dL (ref 0.57–1.00)
Globulin, Total: 2.4 g/dL (ref 1.5–4.5)
Glucose: 88 mg/dL (ref 65–99)
Potassium: 4.5 mmol/L (ref 3.5–5.2)
Sodium: 139 mmol/L (ref 134–144)
Total Protein: 7.2 g/dL (ref 6.0–8.5)
eGFR: 100 mL/min/{1.73_m2} (ref >59)

## 2020-09-10 LAB — CBC WITH DIFFERENTIAL/PLATELET
Basophils Absolute: 0.1 10*3/uL (ref 0.0–0.2)
Basos: 1 %
EOS (ABSOLUTE): 0.3 10*3/uL (ref 0.0–0.4)
Eos: 5 %
Hematocrit: 47.9 % — ABNORMAL HIGH (ref 34.0–46.6)
Hemoglobin: 15.6 g/dL (ref 11.1–15.9)
Immature Grans (Abs): 0 10*3/uL (ref 0.0–0.1)
Immature Granulocytes: 0 %
Lymphocytes Absolute: 1.9 10*3/uL (ref 0.7–3.1)
Lymphs: 31 %
MCH: 29.5 pg (ref 26.6–33.0)
MCHC: 32.6 g/dL (ref 31.5–35.7)
MCV: 91 fL (ref 79–97)
Monocytes Absolute: 0.5 10*3/uL (ref 0.1–0.9)
Monocytes: 8 %
Neutrophils Absolute: 3.3 10*3/uL (ref 1.4–7.0)
Neutrophils: 55 %
Platelets: 135 10*3/uL — ABNORMAL LOW (ref 150–450)
RBC: 5.28 x10E6/uL (ref 3.77–5.28)
RDW: 12.7 % (ref 11.7–15.4)
WBC: 6 10*3/uL (ref 3.4–10.8)

## 2020-09-10 LAB — TSH: TSH: 2.22 u[IU]/mL (ref 0.450–4.500)

## 2020-09-10 LAB — HIV ANTIBODY (ROUTINE TESTING W REFLEX): HIV Screen 4th Generation wRfx: NONREACTIVE

## 2020-09-10 LAB — LIPID PANEL W/O CHOL/HDL RATIO
Cholesterol, Total: 246 mg/dL — ABNORMAL HIGH (ref 100–199)
HDL: 82 mg/dL (ref >39)
LDL Chol Calc (NIH): 153 mg/dL — ABNORMAL HIGH (ref 0–99)
Triglycerides: 67 mg/dL (ref 0–149)
VLDL Cholesterol Cal: 11 mg/dL (ref 5–40)

## 2020-09-10 LAB — VITAMIN D 25 HYDROXY (VIT D DEFICIENCY, FRACTURES): Vit D, 25-Hydroxy: 50.3 ng/mL (ref 30.0–100.0)

## 2020-09-15 ENCOUNTER — Other Ambulatory Visit: Payer: Self-pay

## 2020-09-15 ENCOUNTER — Ambulatory Visit
Admission: RE | Admit: 2020-09-15 | Discharge: 2020-09-15 | Disposition: A | Discharge status: Home / Self Care | Payer: Managed Care, Other (non HMO) | Source: Ambulatory Visit | Attending: Unknown Physician Specialty | Admitting: Unknown Physician Specialty

## 2020-09-15 DIAGNOSIS — Z0000 Encounter for general adult medical examination without abnormal findings: Secondary | ICD-10-CM

## 2020-09-15 DIAGNOSIS — Z1231 Encounter for screening mammogram for malignant neoplasm of breast: Secondary | ICD-10-CM | POA: Diagnosis not present

## 2020-10-02 LAB — SPECIMEN STATUS REPORT

## 2020-10-02 LAB — HGB A1C W/O EAG: Hgb A1c MFr Bld: 5.6 % (ref 4.8–5.6)

## 2021-01-08 DIAGNOSIS — R040 Epistaxis: Secondary | ICD-10-CM | POA: Insufficient documentation

## 2021-01-09 ENCOUNTER — Encounter: Payer: Self-pay | Admitting: Emergency Medicine

## 2021-01-09 ENCOUNTER — Other Ambulatory Visit: Payer: Self-pay

## 2021-01-09 ENCOUNTER — Emergency Department
Admission: EM | Admit: 2021-01-09 | Discharge: 2021-01-09 | Disposition: A | Discharge status: Home / Self Care | Payer: Managed Care, Other (non HMO) | Attending: Emergency Medicine | Admitting: Emergency Medicine

## 2021-01-09 DIAGNOSIS — R040 Epistaxis: Secondary | ICD-10-CM

## 2021-01-09 LAB — COMPREHENSIVE METABOLIC PANEL
ALT: 19 U/L (ref 0–44)
AST: 18 U/L (ref 15–41)
Albumin: 4.1 g/dL (ref 3.5–5.0)
Alkaline Phosphatase: 74 U/L (ref 38–126)
Anion gap: 7 (ref 5–15)
BUN: 27 mg/dL — ABNORMAL HIGH (ref 6–20)
CO2: 28 mmol/L (ref 22–32)
Calcium: 9.3 mg/dL (ref 8.9–10.3)
Chloride: 104 mmol/L (ref 98–111)
Creatinine, Ser: 0.77 mg/dL (ref 0.44–1.00)
GFR, Estimated: >60 mL/min (ref >60)
Glucose, Bld: 106 mg/dL — ABNORMAL HIGH (ref 70–99)
Potassium: 3.9 mmol/L (ref 3.5–5.1)
Sodium: 139 mmol/L (ref 135–145)
Total Bilirubin: 0.6 mg/dL (ref 0.3–1.2)
Total Protein: 6.8 g/dL (ref 6.5–8.1)

## 2021-01-09 LAB — CBC
HCT: 44.6 % (ref 36.0–46.0)
Hemoglobin: 14.5 g/dL (ref 12.0–15.0)
MCH: 30.8 pg (ref 26.0–34.0)
MCHC: 32.5 g/dL (ref 30.0–36.0)
MCV: 94.7 fL (ref 80.0–100.0)
Platelets: 137 10*3/uL — ABNORMAL LOW (ref 150–400)
RBC: 4.71 MIL/uL (ref 3.87–5.11)
RDW: 13.7 % (ref 11.5–15.5)
WBC: 7.4 10*3/uL (ref 4.0–10.5)
nRBC: 0 % (ref 0.0–0.2)

## 2021-01-09 MED ORDER — OXYMETAZOLINE HCL 0.05 % NA SOLN
1.0000 | Freq: Once | NASAL | Status: AC
  Administered 2021-01-09: 1 via NASAL
  Filled 2021-01-09: qty 30

## 2021-01-09 NOTE — ED Notes (Signed)
Nose clamp applied

## 2021-01-09 NOTE — ED Notes (Signed)
Sitting up in stretcher  family at bedside  no active bleeding noted at present

## 2021-01-09 NOTE — ED Triage Notes (Signed)
Pt reports nose bleed that started on Tuesday reports today she was sitting in bible study and started to have nose bleed. Pt reports after a while it stooped and now was trying to go to bed and started to have nose bleed again. Pt denies any blood thinners medication as part of her medication denies any injuries to nose area.

## 2021-01-09 NOTE — Discharge Instructions (Addendum)
You may use nasal saline to keep your mucous membranes moist. You may use a humidifier.  Other than nasal saline, please do not put anything into your nose for the next 3-4 days. Please do not blow your nose for the next 3-4 days. If your nose begins to bleed again, at this time it is okay to blow your nose and blow out all of the clots and then spray Afrin nasal spray into both nostrils and hold direct pressure for 30 minutes without stopping. If this does not stop the bleeding, please return to the hospital.

## 2021-01-09 NOTE — ED Provider Notes (Signed)
Patient signed out to me by Dr. Leonides Schanz.  60-year-old female presenting with epistaxis.  At the time of signout she has received Afrin and has pressure applied via clamp.  Pending reassessment.  On my reassessment patient has the clamp off and she is no longer having any bleeding.  Patient was very resistant to packing.  Given she is hemostatic at this point she can be discharged.  We discussed using the Afrin at home and holding for at least 15 minutes of constant pressure and if still bleeding at this point she would need to return to the emergency department for likely packing.   Rada Hay, MD 01/09/21 [phone]

## 2021-01-09 NOTE — ED Notes (Signed)
See triage note  presents with nose bleed which started yesterday  denies any injury  denies taking any blood thinners

## 2021-01-09 NOTE — ED Provider Notes (Signed)
[facility] Provider Note  ____________________________________________   Event Date/Time   First MD Initiated Contact with Patient 01/09/21 0601     (approximate)  I have reviewed the triage vital signs and the nursing notes.   HISTORY  Chief Complaint Epistaxis    HPI Katelyn Ochoa is a 60 y.o. female with history of intermittent bleeding from the left nostril for the past day.  She denies any head injury.  No recent sinus surgery, injury to the face or nose.  She has not been putting anything into her nose.  States it will stop intermittently and then start dripping again.  She is not on blood thinners.  Has been slightly hypertensive.  Not on medications for the same.  Denies headache, vision changes, chest pain, shortness of breath, numbness, tingling or weakness.  Does have history of thrombocytopenia.        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 No pertinent surgical history.  Prior to Admission medications   Medication Sig Start Date End Date Taking? Authorizing Provider  Multiple Vitamin (MULTIVITAMIN) tablet Take 1 tablet by mouth daily.    [provider]  Vit B6-Vit B12-Omega 3 Acids (VITAMIN B PLUS+ PO) Take 1 tablet by mouth.     [provider]    Allergies Penicillins  Family History  Problem Relation Age of Onset   Hyperlipidemia Mother    Heart disease Father    COPD Father    Breast cancer Daughter 25   Cancer Daughter        breast   Breast cancer Maternal Aunt    Breast cancer  Maternal Grandmother    Colon cancer Neg Hx    Ovarian cancer Neg Hx     Social History Social History   Tobacco Use   Smoking status: Never   Smokeless tobacco: Never  Vaping Use   Vaping Use: Never used  Substance Use Topics   Alcohol use: Yes    Comment: occasional   Drug use: No    Review of Systems Constitutional: No fever. Eyes: No visual changes. ENT: No sore throat. Cardiovascular: Denies chest pain. Respiratory: Denies shortness of breath. Gastrointestinal: No nausea, vomiting, diarrhea. Genitourinary: Negative for dysuria. Musculoskeletal: Negative for back pain. Skin: Negative for rash. Neurological: Negative for focal weakness or numbness.  ____________________________________________   PHYSICAL EXAM:  VITAL SIGNS: ED Triage Vitals  Enc Vitals Group     BP 01/09/21 0039 (!) 171/86     Pulse Rate 01/09/21 0039 78     Resp 01/09/21 0039 20     Temp 01/09/21 0039 98 F (36.7 C)     Temp Source 01/09/21 0039 Oral     SpO2 01/09/21 0039 98 %     Weight 01/09/21 0040 170 lb (77.1 kg)     Height 01/09/21 0040 5\' 5"  (1.651 m)     Head Circumference --      Peak Flow --      Pain Score 01/09/21 0040 0     Pain Loc --  Pain Edu? --      Excl. in [place]? --    CONSTITUTIONAL: Alert and oriented and responds appropriately to questions. Well-appearing; well-nourished HEAD: Normocephalic EYES: Conjunctivae clear, pupils appear equal, EOM appear intact ENT: normal nose; moist mucous membranes, small amount of blood noted in the anterior portion of the left nostril with a small amount of blood in the posterior oropharynx NECK: Supple, normal ROM CARD: RRR; S1 and S2 appreciated; no murmurs, no clicks, no rubs, no gallops RESP: Normal chest excursion without splinting or tachypnea; breath sounds clear and equal bilaterally; no wheezes, no rhonchi, no rales, no hypoxia or respiratory distress, speaking full sentences ABD/GI: Normal bowel sounds; non-distended;  soft, non-tender, no rebound, no guarding, no peritoneal signs, no hepatosplenomegaly BACK: The back appears normal EXT: Normal ROM in all joints; no deformity noted, no edema; no cyanosis SKIN: Normal color for age and race; warm; no rash on exposed skin NEURO: Moves all extremities equally PSYCH: The patient's mood and manner are appropriate.  ____________________________________________   LABS (all labs ordered are listed, but only abnormal results are displayed)  Labs Reviewed  CBC - Abnormal; Notable for the following components:      Result Value   Platelets 137 (*)    All other components within normal limits  COMPREHENSIVE METABOLIC PANEL - Abnormal; Notable for the following components:   Glucose, Bld 106 (*)    BUN 27 (*)    All other components within normal limits   ____________________________________________  EKG   ____________________________________________  RADIOLOGY I, Myha Arizpe, personally viewed and evaluated these images (plain radiographs) as part of my medical decision making, as well as reviewing the written report by the radiologist.  ED MD interpretation:    Official radiology report(s): No results found.  ____________________________________________   PROCEDURES  Procedure(s) performed (including Critical Care):  .Epistaxis Management  Date/Time: 01/09/2021 7:20 AM Performed by: Lachandra Dettmann, Delice Bison, DO Authorized by: Lenin Kuhnle, Delice Bison, DO   Consent:    Consent obtained:  Verbal   Consent given by:  Patient   Risks discussed:  Bleeding, infection, nasal injury and pain   Alternatives discussed:  Alternative treatment Universal protocol:    Procedure explained and questions answered to patient or proxy's satisfaction: yes     Relevant documents present and verified: yes     Test results available: yes     Imaging studies available: yes     Required blood products, implants, devices, and special equipment available: yes     Site/side  marked: yes     Immediately prior to procedure, a time out was called: yes     Patient identity confirmed:  Verbally with patient Anesthesia:    Anesthesia method:  None Procedure details:    Treatment site:  L posterior   Repair method: Afrin and nasal clamp.   Treatment complexity:  Limited   Treatment episode: recurring   Post-procedure details:    Assessment:  No improvement   Procedure completion:  Tolerated well, no immediate complications   ____________________________________________   INITIAL IMPRESSION / ASSESSMENT AND PLAN / ED COURSE  As part of my medical decision making, I reviewed the following data within the [facility] notes reviewed and incorporated, Labs reviewed , Old chart reviewed, and Notes from prior ED visits         Patient here with epistaxis.  Seems to be anterior in nature.  Will spray Afrin in this nostril and then have her hold pressure  for 30 minutes.  Labs obtained in triage are reassuring.  She has mild thrombocytopenia which appears stable.  She has not on any blood thinners.  Blood pressure is minimally elevated here.  ED PROGRESS  Patient continues to have epistaxis after Afrin and direct pressure for 30 minutes.  Have offered nasal packing but she is reluctant.  She would like to try Afrin again and wait for a little bit longer to see if this helps.  I am not able to see where she is bleeding from.  I suspect it is posterior in nature.  Signed out the oncoming ED physician to follow up.   I reviewed all nursing notes and pertinent previous records as available.  I have reviewed and interpreted any EKGs, lab and urine results, imaging (as available).  ____________________________________________   FINAL CLINICAL IMPRESSION(S) / ED DIAGNOSES  Final diagnoses:  Left-sided epistaxis     ED Discharge Orders     None       *Please note:  Katelyn Ochoa was evaluated in Emergency Department on 01/09/2021 for the  symptoms described in the history of present illness. She was evaluated in the context of the global COVID-19 pandemic, which necessitated consideration that the patient might be at risk for infection with the SARS-CoV-2 virus that causes COVID-19. Institutional protocols and algorithms that pertain to the evaluation of patients at risk for COVID-19 are in a state of rapid change based on information released by regulatory bodies including the CDC and federal and state organizations. These policies and algorithms were followed during the patient's care in the ED.  Some ED evaluations and interventions may be delayed as a result of limited staffing during and the pandemic.*   Note:  This document was prepared using Dragon voice recognition software and may include unintentional dictation errors.    Loxley Cibrian, Delice Bison, DO 01/09/21 [phone]

## 2021-09-07 NOTE — Patient Instructions (Signed)
Preventive Care 40-61 Years Old, Female Preventive care refers to lifestyle choices and visits with your health care provider that can promote health and wellness. Preventive care visits are also called wellness exams. What can I expect for my preventive care visit? Counseling Your health care provider may ask you questions about your: Medical history, including: Past medical problems. Family medical history. Pregnancy history. Current health, including: Menstrual cycle. Method of birth control. Emotional well-being. Home life and relationship well-being. Sexual activity and sexual health. Lifestyle, including: Alcohol, nicotine or tobacco, and drug use. Access to firearms. Diet, exercise, and sleep habits. Work and work environment. Sunscreen use. Safety issues such as seatbelt and bike helmet use. Physical exam Your health care provider will check your: Height and weight. These may be used to calculate your BMI (body mass index). BMI is a measurement that tells if you are at a healthy weight. Waist circumference. This measures the distance around your waistline. This measurement also tells if you are at a healthy weight and may help predict your risk of certain diseases, such as type 2 diabetes and high blood pressure. Heart rate and blood pressure. Body temperature. Skin for abnormal spots. What immunizations do I need?  Vaccines are usually given at various ages, according to a schedule. Your health care provider will recommend vaccines for you based on your age, medical history, and lifestyle or other factors, such as travel or where you work. What tests do I need? Screening Your health care provider may recommend screening tests for certain conditions. This may include: Lipid and cholesterol levels. Diabetes screening. This is done by checking your blood sugar (glucose) after you have not eaten for a while (fasting). Pelvic exam and Pap test. Hepatitis B test. Hepatitis C  test. HIV (human immunodeficiency virus) test. STI (sexually transmitted infection) testing, if you are at risk. Lung cancer screening. Colorectal cancer screening. Mammogram. Talk with your health care provider about when you should start having regular mammograms. This may depend on whether you have a family history of breast cancer. BRCA-related cancer screening. This may be done if you have a family history of breast, ovarian, tubal, or peritoneal cancers. Bone density scan. This is done to screen for osteoporosis. Talk with your health care provider about your test results, treatment options, and if necessary, the need for more tests. Follow these instructions at home: Eating and drinking  Eat a diet that includes fresh fruits and vegetables, whole grains, lean protein, and low-fat dairy products. Take vitamin and mineral supplements as recommended by your health care provider. Do not drink alcohol if: Your health care provider tells you not to drink. You are pregnant, may be pregnant, or are planning to become pregnant. If you drink alcohol: Limit how much you have to 0-1 drink a day. Know how much alcohol is in your drink. In the [place] one drink equals one 12 oz bottle of beer (355 mL), one 5 oz glass of wine (148 mL), or one 1 oz glass of hard liquor (44 mL). Lifestyle Brush your teeth every morning and night with fluoride toothpaste. Floss one time each day. Exercise for at least 30 minutes 5 or more days each week. Do not use any products that contain nicotine or tobacco. These products include cigarettes, chewing tobacco, and vaping devices, such as e-cigarettes. If you need help quitting, ask your health care provider. Do not use drugs. If you are sexually active, practice safe sex. Use a condom or other form of protection to   prevent STIs. If you do not wish to become pregnant, use a form of birth control. If you plan to become pregnant, see your health care provider for a  prepregnancy visit. Take aspirin only as told by your health care provider. Make sure that you understand how much to take and what form to take. Work with your health care provider to find out whether it is safe and beneficial for you to take aspirin daily. Find healthy ways to manage stress, such as: Meditation, yoga, or listening to music. Journaling. Talking to a trusted person. Spending time with friends and family. Minimize exposure to UV radiation to reduce your risk of skin cancer. Safety Always wear your seat belt while driving or riding in a vehicle. Do not drive: If you have been drinking alcohol. Do not ride with someone who has been drinking. When you are tired or distracted. While texting. If you have been using any mind-altering substances or drugs. Wear a helmet and other protective equipment during sports activities. If you have firearms in your house, make sure you follow all gun safety procedures. Seek help if you have been physically or sexually abused. What's next? Visit your health care provider once a year for an annual wellness visit. Ask your health care provider how often you should have your eyes and teeth checked. Stay up to date on all vaccines. This information is not intended to replace advice given to you by your health care provider. Make sure you discuss any questions you have with your health care provider. Document Revised: 08/18/2020 Document Reviewed: 08/18/2020 Elsevier Patient Education  [place].

## 2021-09-12 ENCOUNTER — Encounter: Payer: Self-pay | Admitting: Family Medicine

## 2021-09-12 ENCOUNTER — Ambulatory Visit (INDEPENDENT_AMBULATORY_CARE_PROVIDER_SITE_OTHER): Payer: Managed Care, Other (non HMO) | Admitting: Family Medicine

## 2021-09-12 ENCOUNTER — Other Ambulatory Visit (HOSPITAL_COMMUNITY)
Admission: RE | Admit: 2021-09-12 | Discharge: 2021-09-12 | Disposition: A | Discharge status: Home / Self Care | Payer: Managed Care, Other (non HMO) | Source: Ambulatory Visit | Attending: Family Medicine | Admitting: Family Medicine

## 2021-09-12 VITALS — BP 130/74 | HR 89 | Temp 98.1°F | Resp 16 | Ht 64.0 in | Wt 171.2 lb

## 2021-09-12 DIAGNOSIS — Z1211 Encounter for screening for malignant neoplasm of colon: Secondary | ICD-10-CM | POA: Diagnosis not present

## 2021-09-12 DIAGNOSIS — Z0000 Encounter for general adult medical examination without abnormal findings: Secondary | ICD-10-CM | POA: Diagnosis not present

## 2021-09-12 DIAGNOSIS — Z23 Encounter for immunization: Secondary | ICD-10-CM

## 2021-09-12 DIAGNOSIS — Z9189 Other specified personal risk factors, not elsewhere classified: Secondary | ICD-10-CM

## 2021-09-12 DIAGNOSIS — Z780 Asymptomatic menopausal state: Secondary | ICD-10-CM

## 2021-09-12 DIAGNOSIS — Z124 Encounter for screening for malignant neoplasm of cervix: Secondary | ICD-10-CM

## 2021-09-12 DIAGNOSIS — Z1231 Encounter for screening mammogram for malignant neoplasm of breast: Secondary | ICD-10-CM | POA: Diagnosis not present

## 2021-09-12 DIAGNOSIS — F17200 Nicotine dependence, unspecified, uncomplicated: Secondary | ICD-10-CM

## 2021-09-12 DIAGNOSIS — M8580 Other specified disorders of bone density and structure, unspecified site: Secondary | ICD-10-CM

## 2021-09-12 DIAGNOSIS — D696 Thrombocytopenia, unspecified: Secondary | ICD-10-CM

## 2021-09-12 MED ORDER — ZOSTER VAC RECOMB ADJUVANTED 50 MCG/0.5ML IM SUSR: 0.5000 mL | Freq: Once | INTRAMUSCULAR | 1 refills | Status: AC

## 2021-09-12 NOTE — Progress Notes (Addendum)
Patient: Katelyn Ochoa, Female    DOB: [birth year], 61 y.o.   MRN: [id number] Delsa Grana, PA-C Visit Date: 09/12/2021  Today's Provider: Delsa Grana, PA-C   Chief Complaint  Patient presents with   Annual Exam   Subjective:   Annual physical exam:  Katelyn Ochoa is a 61 y.o. female who presents today for complete physical exam:  Exercise/Activity:  a lot of walking and up and down steps at work  Diet/nutrition:  overall healthy, lots of water Sleep:  no concerns  SDOH Screenings   Alcohol Screen: Low Risk  (09/12/2021)   Alcohol Screen    Last Alcohol Screening Score (AUDIT): 0  Depression (PHQ2-9): Low Risk  (09/12/2021)   Depression (PHQ2-9)    PHQ-2 Score: 0  Financial Resource Strain: Low Risk  (09/12/2021)   Overall Financial Resource Strain (CARDIA)    Difficulty of Paying Living Expenses: Not hard at all  Food Insecurity: No Food Insecurity (09/12/2021)   Hunger Vital Sign    Worried About Running Out of Food in the Last Year: Never true    [place] in the Last Year: Never true  Housing: Low Risk  (09/12/2021)   Housing    Last Housing Risk Score: 0  Physical Activity: Inactive (09/12/2021)   Exercise Vital Sign    Days of Exercise per Week: 0 days    Minutes of Exercise per Session: 0 min  Social Connections: Socially Integrated (09/12/2021)   Social Connection and Isolation Panel [NHANES]    Frequency of Communication with Friends and Family: Three times a week    Frequency of Social Gatherings with Friends and Family: Three times a week    Attends Religious Services: More than 4 times per year    Active Member of Clubs or Organizations: Yes    Attends Archivist Meetings: More than 4 times per year    Marital Status: Married  Stress: No Stress Concern Present (09/12/2021)   [place]    Feeling of Stress : Only a little  Tobacco Use: Low Risk  (09/12/2021)   Patient History     Smoking Tobacco Use: Never    Smokeless Tobacco Use: Never    Passive Exposure: Not on file  Transportation Needs: No Transportation Needs (09/12/2021)   PRAPARE - Transportation    Lack of Transportation (Medical): No    Lack of Transportation (Non-Medical): No      USPSTF grade A and B recommendations - reviewed and addressed today  Depression:  Phq 9 completed today by patient, was reviewed by me with patient in the room PHQ score is neg, pt feels good    09/12/2021    3:20 PM 09/09/2020    2:53 PM 08/29/2019    2:19 PM 07/30/2018    3:27 PM  PHQ 2/9 Scores  PHQ - 2 Score 0 6 0 0  PHQ- 9 Score 0 6 0 0      09/12/2021    3:20 PM 09/09/2020    2:53 PM 08/29/2019    2:19 PM 07/30/2018    3:27 PM 07/27/2017    8:26 AM  Depression screen PHQ 2/9  Decreased Interest 0 3 0 0 0  Down, Depressed, Hopeless 0 3 0 0 0  PHQ - 2 Score 0 6 0 0 0  Altered sleeping 0 0 0 0   Tired, decreased energy 0 0 0 0   Change in appetite 0  0 0 0   Feeling bad or failure about yourself  0 0 0 0   Trouble concentrating 0 0 0 0   Moving slowly or fidgety/restless 0 0 0 0   Suicidal thoughts 0 0 0 0   PHQ-9 Score 0 6 0 0   Difficult doing work/chores Not difficult at all Not difficult at all Not difficult at all Not difficult at all     Alcohol screening: [place] Office Visit from 09/12/2021 in [facility]  AUDIT-C Score 0       Immunizations and Health Maintenance: Health Maintenance  Topic Date Due   Zoster Vaccines- Shingrix (1 of 2) Never done   Fecal DNA (Cologuard)  08/17/2020   PAP SMEAR-Modifier  07/30/2021   MAMMOGRAM  09/15/2021   INFLUENZA VACCINE  10/04/2021   TETANUS/TDAP  07/11/2023   Hepatitis C Screening  Completed   HIV Screening  Completed   HPV VACCINES  Aged Out     Hep C Screening: done previously   STD testing and prevention (HIV/chl/gon/syphilis):  see above, no additional testing desired by pt today  Intimate partner  violence:denies  Sexual History/Pain during Intercourse: Married  - not currently sexually active  Menstrual History/LMP/Abnormal Bleeding:  denies No LMP recorded. Patient is postmenopausal.  Incontinence Symptoms: only 1-2 x with bronchitis stress incontinence but not day to day, no urge incontinence or other concerns  Breast cancer: due Last Mammogram: *see HM list above BRCA gene screening: none despite family breast CA hx Maternal grandmother, maternal aunt and her daughter  Cervical cancer screening: due Denies any other family hx of ovarian, uterine, colon cancers  Osteoporosis:   Discussion on osteoporosis per age, including high calcium and vitamin D supplementation, weight bearing exercises Pt is supplementing with daily calcium/Vit D. No DEXA in the past  Roughly experienced menopause at age 43-15+ years ago  Skin cancer:  Hx of skin CA -  NO Discussed atypical lesions   Colorectal cancer:   Colonoscopy is due - wishes to do cologuard   Discussed concerning signs and sx of CRC, pt denies change in bowels, blood in stool  Lung cancer:   Low Dose CT Chest recommended if Age 8-80 years, 20 pack-year currently smoking OR have quit w/in 15years. Patient does not qualify.    Social History   Tobacco Use   Smoking status: Never   Smokeless tobacco: Never  Vaping Use   Vaping Use: Never used  Substance Use Topics   Alcohol use: Not Currently   Drug use: No     Flowsheet Row Office Visit from 09/12/2021 in [facility]  AUDIT-C Score 0       Family History  Problem Relation Age of Onset   Hyperlipidemia Mother    Heart disease Father    COPD Father    Breast cancer Daughter 64   Cancer Daughter        breast   Breast cancer Maternal Aunt    Breast cancer Maternal Grandmother    Colon cancer Neg Hx    Ovarian cancer Neg Hx      Blood pressure/Hypertension: BP Readings from Last 3 Encounters:  09/12/21 130/74  01/09/21 (!) 150/90   09/09/20 118/76    Weight/Obesity: Wt Readings from Last 3 Encounters:  09/12/21 171 lb 3.2 oz (77.7 kg)  01/09/21 170 lb (77.1 kg)  09/09/20 173 lb 8 oz (78.7 kg)   BMI Readings from Last 3 Encounters:  09/12/21 29.39  kg/m  01/09/21 28.29 kg/m  09/09/20 29.78 kg/m     Lipids:  Lab Results  Component Value Date   CHOL 246 (H) 09/09/2020   CHOL 225 (H) 09/05/2019   CHOL 241 (H) 08/26/2018   Lab Results  Component Value Date   HDL 82 09/09/2020   HDL 70 09/05/2019   HDL 84 08/26/2018   Lab Results  Component Value Date   LDLCALC 153 (H) 09/09/2020   LDLCALC 138 (H) 09/05/2019   LDLCALC 142 (H) 08/26/2018   Lab Results  Component Value Date   TRIG 67 09/09/2020   TRIG 97 09/05/2019   TRIG 73 08/26/2018   Lab Results  Component Value Date   CHOLHDL 3.2 09/05/2019   CHOLHDL 2.9 08/26/2018   CHOLHDL 2.8 07/27/2017   No results found for: "LDLDIRECT" Based on the results of lipid panel his/her cardiovascular risk factor ( using [place] )  in the next 10 years is: The 10-year ASCVD risk score (Arnett DK, et al., 2019) is: 3.4%   Values used to calculate the score:     Age: 39 years     Sex: Female     Is Non-Hispanic African American: No     Diabetic: No     Tobacco smoker: No     Systolic Blood Pressure: 295 mmHg     Is BP treated: No     HDL Cholesterol: 82 mg/dL     Total Cholesterol: 246 mg/dL  Glucose:  Glucose  Date Value Ref Range Status  09/09/2020 88 65 - 99 mg/dL Final  09/05/2019 79 65 - 99 mg/dL Final  08/26/2018 87 65 - 99 mg/dL Final   Glucose, Bld  Date Value Ref Range Status  01/09/2021 106 (H) 70 - 99 mg/dL Final    Comment:    Glucose reference range applies only to samples taken after fasting for at least 8 hours.  07/25/2016 91 65 - 99 mg/dL Final    Advanced Care Planning:  A voluntary discussion about advance care planning including the explanation and discussion of advance directives.   Discussed health care proxy  and Living will, and the patient was able to identify a health care proxy as Myrel Rappleye - husband.   Patient does not have a living will at present time.   Social History       Social History   Socioeconomic History   Marital status: Married    Spouse name: Patrick Jupiter   Number of children: 3   Years of education: 12   Highest education level: High school graduate  Occupational History   Not on file  Tobacco Use   Smoking status: Never   Smokeless tobacco: Never  Vaping Use   Vaping Use: Never used  Substance and Sexual Activity   Alcohol use: Not Currently   Drug use: No   Sexual activity: Not Currently    Birth control/protection: None  Other Topics Concern   Not on file  Social History Narrative   occassional alcohol/over week ends; never smoked; in [place]; in [facility].    Social Determinants of Health   Financial Resource Strain: Low Risk  (09/12/2021)   Overall Financial Resource Strain (CARDIA)    Difficulty of Paying Living Expenses: Not hard at all  Food Insecurity: No Food Insecurity (09/12/2021)   Hunger Vital Sign    Worried About Running Out of Food in the Last Year: Never true    [place] in the Last Year: Never true  Transportation Needs: No Transportation Needs (09/12/2021)   PRAPARE - Hydrologist (Medical): No    Lack of Transportation (Non-Medical): No  Physical Activity: Inactive (09/12/2021)   Exercise Vital Sign    Days of Exercise per Week: 0 days    Minutes of Exercise per Session: 0 min  Stress: No Stress Concern Present (09/12/2021)   [place]    Feeling of Stress : Only a little  Social Connections: Socially Integrated (09/12/2021)   Social Connection and Isolation Panel [NHANES]    Frequency of Communication with Friends and Family: Three times a week    Frequency of Social Gatherings with Friends and Family: Three times a week    Attends  Religious Services: More than 4 times per year    Active Member of Clubs or Organizations: Yes    Attends Music therapist: More than 4 times per year    Marital Status: Married    Family History        Family History  Problem Relation Age of Onset   Hyperlipidemia Mother    Heart disease Father    COPD Father    Breast cancer Daughter 60   Cancer Daughter        breast   Breast cancer Maternal Aunt    Breast cancer Maternal Grandmother    Colon cancer Neg Hx    Ovarian cancer Neg Hx     Patient Active Problem List   Diagnosis Date Noted   Thrombocytopenia ([place]) 09/02/2018   Elevated hemoglobin ([place]) 07/26/2016   Colon cancer screening 07/22/2015   Preventative health care 07/22/2015   Neoplasm of uncertain behavior of skin of lower leg 07/22/2015   Pap smear for cervical cancer screening 07/22/2015    History reviewed. No pertinent surgical history.   Current Outpatient Medications:    Multiple Vitamin (MULTIVITAMIN) tablet, Take 1 tablet by mouth daily., Disp: , Rfl:    Vit B6-Vit B12-Omega 3 Acids (VITAMIN B PLUS+ PO), Take 1 tablet by mouth. , Disp: , Rfl:   Allergies  Allergen Reactions   Penicillins Rash    Patient Care Team: Delsa Grana, PA-C as PCP - General (Family Medicine)   Chart Review: I personally reviewed active problem list, medication list, allergies, family history, social history, health maintenance, notes from last encounter, lab results, imaging with the patient/caregiver today.   Review of Systems  Constitutional: Negative.   HENT: Negative.    Eyes: Negative.   Respiratory: Negative.    Cardiovascular: Negative.   Gastrointestinal: Negative.   Endocrine: Negative.   Genitourinary: Negative.   Musculoskeletal: Negative.   Skin: Negative.   Allergic/Immunologic: Negative.   Neurological: Negative.   Hematological: Negative.   Psychiatric/Behavioral: Negative.    All other systems reviewed and are negative.          Objective:   Vitals:  Vitals:   09/12/21 1524  BP: 130/74  Pulse: 89  Resp: 16  Temp: 98.1 F (36.7 C)  TempSrc: Oral  SpO2: 96%  Weight: 171 lb 3.2 oz (77.7 kg)  Height: _0  (1.626 m)    Body mass index is 29.39 kg/m.  Physical Exam Vitals and nursing note reviewed. Exam conducted with a chaperone present.  Constitutional:      General: She is not in acute distress.    Appearance: Normal appearance. She is well-developed. She is not ill-appearing, toxic-appearing or diaphoretic.     Interventions: Face  mask in place.  HENT:     Head: Normocephalic and atraumatic.     Right Ear: Tympanic membrane, ear canal and external ear normal. There is no impacted cerumen.     Left Ear: Tympanic membrane, ear canal and external ear normal. There is no impacted cerumen.     Nose: Nose normal. No congestion.     Mouth/Throat:     Mouth: Mucous membranes are moist.     Pharynx: Oropharynx is clear. Posterior oropharyngeal erythema present. No oropharyngeal exudate.  Eyes:     General: Lids are normal. No scleral icterus.       Right eye: No discharge.        Left eye: No discharge.     Conjunctiva/sclera: Conjunctivae normal.     Pupils: Pupils are equal, round, and reactive to light.  Neck:     Trachea: Phonation normal. No tracheal deviation.  Cardiovascular:     Rate and Rhythm: Normal rate and regular rhythm.     Pulses: Normal pulses.          Radial pulses are 2+ on the right side and 2+ on the left side.       Posterior tibial pulses are 2+ on the right side and 2+ on the left side.     Heart sounds: Normal heart sounds. No murmur heard.    No friction rub. No gallop.  Pulmonary:     Effort: Pulmonary effort is normal. No respiratory distress.     Breath sounds: Normal breath sounds. No stridor. No wheezing, rhonchi or rales.  Chest:     Chest wall: No tenderness.  Abdominal:     General: Bowel sounds are normal. There is no distension.     Palpations: Abdomen is  soft.     Tenderness: There is no abdominal tenderness. There is no right CVA tenderness or left CVA tenderness.  Genitourinary:    General: Normal vulva.     Vagina: No signs of injury and foreign body. Vaginal discharge present. No erythema, tenderness, lesions or prolapsed vaginal walls.     Cervix: No cervical motion tenderness, discharge, friability, lesion, erythema, cervical bleeding or eversion.     Uterus: Normal.      Adnexa: Right adnexa normal and left adnexa normal.     Comments: PAP obtained with broom Musculoskeletal:     Cervical back: Normal range of motion. No rigidity.     Right lower leg: No edema.     Left lower leg: No edema.  Skin:    General: Skin is warm and dry.     Coloration: Skin is not jaundiced or pale.     Findings: No rash.  Neurological:     Mental Status: She is alert. Mental status is at baseline.     Motor: No abnormal muscle tone.     Coordination: Coordination normal.     Gait: Gait normal.  Psychiatric:        Mood and Affect: Mood normal.        Speech: Speech normal.        Behavior: Behavior normal.       Fall Risk:    09/12/2021    3:20 PM 09/09/2020    2:45 PM 08/29/2019    2:19 PM 07/30/2018    3:27 PM 07/27/2017    8:26 AM  Fall Risk   Falls in the past year? 0 0 0 0 No  Number falls in past yr: 0 0 0  Injury with Fall? 0 0 0    Risk for fall due to : No Fall Risks      Follow up Falls prevention discussed;Education provided Falls evaluation completed       Functional Status Survey: Is the patient deaf or have difficulty hearing?: No Does the patient have difficulty seeing, even when wearing glasses/contacts?: No Does the patient have difficulty concentrating, remembering, or making decisions?: No Does the patient have difficulty walking or climbing stairs?: No Does the patient have difficulty dressing or bathing?: No Does the patient have difficulty doing errands alone such as visiting a doctor's office or shopping?:  No   Assessment & Plan:    CPE completed today  USPSTF grade A and B recommendations reviewed with patient; age-appropriate recommendations, preventive care, screening tests, etc discussed and encouraged; healthy living encouraged; see AVS for patient education given to patient  Discussed importance of 150 minutes of physical activity weekly, AHA exercise recommendations given to pt in AVS/handout  Discussed importance of healthy diet:  eating lean meats and proteins, avoiding trans fats and saturated fats, avoid simple sugars and excessive carbs in diet, eat 6 servings of fruit/vegetables daily and drink plenty of water and avoid sweet beverages.    Recommended pt to do annual eye exam and routine dental exams/cleanings  Depression, alcohol, fall screening completed as documented above and per flowsheets  Advance Care planning information and packet discussed and offered today, encouraged pt to discuss with family members/spouse/partner/friends and complete Advanced directive packet and bring copy to office   Reviewed Health Maintenance: Health Maintenance  Topic Date Due   Zoster Vaccines- Shingrix (1 of 2) Never done   Fecal DNA (Cologuard)  08/17/2020   PAP SMEAR-Modifier  07/30/2021   MAMMOGRAM  09/15/2021   INFLUENZA VACCINE  10/04/2021   TETANUS/TDAP  07/11/2023   Hepatitis C Screening  Completed   HIV Screening  Completed   HPV VACCINES  Aged Out    Immunizations: Immunization History  Administered Date(s) Administered   MMR 08/27/2017   Tdap 07/10/2013   Vaccines:  Shingrix: 45-64 yo and ask insurance if covered when patient above 50 yo - offered - sent to pharmacy  Pneumonia: n/a educated and discussed with patient. Flu: not in season (due winter respiratory viral season - oct 2023)    ICD-10-CM   1. Annual physical exam  Z00.00 CBC with Differential/Platelet    Hemoglobin A1c    Lipid panel    TSH    Comprehensive metabolic panel    Vitamin D 1,25  dihydroxy    2. Screening for colon cancer  Z12.11 Cologuard    3. Screening for cervical cancer  Z12.4 Cytology - PAP   some discharge with no GU complaints, low risk, PAP + HPV today and then may age out    45. Encounter for screening mammogram for malignant neoplasm of breast  Z12.31 MM 3D SCREEN BREAST BILATERAL    5. Postmenopausal estrogen deficiency  Z78.0 Vitamin D 1,25 dihydroxy    DG Bone Density   10 + years after menopause     6. Need for shingles vaccine  Z23 Zoster Vaccine Adjuvanted [facility]) injection    7. Thrombocytopenia (HCC) Chronic D69.6    chronic, no sig change over time, will continue to monitor    8. At risk for osteopenia due to history of osteoporosis  Z91.89 Vitamin D 1,25 dihydroxy    9. Current smoker  F17.200 Vitamin D 1,25 dihydroxy    10. Osteopenia  after menopause  M85.80 Vitamin D 1,25 dihydroxy   Z78.0           Delsa Grana, PA-C 09/12/21 3:37 PM  [facility]

## 2021-09-14 LAB — CYTOLOGY - PAP
Adequacy: ABSENT
Comment: NEGATIVE
Diagnosis: NEGATIVE
High risk HPV: NEGATIVE

## 2021-09-19 LAB — CBC WITH DIFFERENTIAL/PLATELET
Basophils Absolute: 0.1 10*3/uL (ref 0.0–0.2)
Basos: 1 %
EOS (ABSOLUTE): 0.4 10*3/uL (ref 0.0–0.4)
Eos: 7 %
Hematocrit: 48.2 % — ABNORMAL HIGH (ref 34.0–46.6)
Hemoglobin: 16.2 g/dL — ABNORMAL HIGH (ref 11.1–15.9)
Immature Grans (Abs): 0 10*3/uL (ref 0.0–0.1)
Immature Granulocytes: 0 %
Lymphocytes Absolute: 1.7 10*3/uL (ref 0.7–3.1)
Lymphs: 31 %
MCH: 30.5 pg (ref 26.6–33.0)
MCHC: 33.6 g/dL (ref 31.5–35.7)
MCV: 91 fL (ref 79–97)
Monocytes Absolute: 0.5 10*3/uL (ref 0.1–0.9)
Monocytes: 9 %
Neutrophils Absolute: 2.8 10*3/uL (ref 1.4–7.0)
Neutrophils: 52 %
Platelets: 140 10*3/uL — ABNORMAL LOW (ref 150–450)
RBC: 5.32 x10E6/uL — ABNORMAL HIGH (ref 3.77–5.28)
RDW: 13 % (ref 11.7–15.4)
WBC: 5.4 10*3/uL (ref 3.4–10.8)

## 2021-09-19 LAB — HEMOGLOBIN A1C
Est. average glucose Bld gHb Est-mCnc: 111 mg/dL
Hgb A1c MFr Bld: 5.5 % (ref 4.8–5.6)

## 2021-09-19 LAB — VITAMIN D 1,25 DIHYDROXY
Vitamin D 1, 25 (OH)2 Total: 56 pg/mL
Vitamin D2 1, 25 (OH)2: <10 pg/mL
Vitamin D3 1, 25 (OH)2: 56 pg/mL

## 2021-09-19 LAB — LIPID PANEL
Chol/HDL Ratio: 3.2 ratio (ref 0.0–4.4)
Cholesterol, Total: 237 mg/dL — ABNORMAL HIGH (ref 100–199)
HDL: 73 mg/dL (ref >39)
LDL Chol Calc (NIH): 153 mg/dL — ABNORMAL HIGH (ref 0–99)
Triglycerides: 68 mg/dL (ref 0–149)
VLDL Cholesterol Cal: 11 mg/dL (ref 5–40)

## 2021-09-19 LAB — COMPREHENSIVE METABOLIC PANEL
ALT: 18 IU/L (ref 0–32)
AST: 21 IU/L (ref 0–40)
Albumin/Globulin Ratio: 1.8 (ref 1.2–2.2)
Albumin: 4.6 g/dL (ref 3.9–4.9)
Alkaline Phosphatase: 109 IU/L (ref 44–121)
BUN/Creatinine Ratio: 19 (ref 12–28)
BUN: 15 mg/dL (ref 8–27)
Bilirubin Total: 0.5 mg/dL (ref 0.0–1.2)
CO2: 20 mmol/L (ref 20–29)
Calcium: 10.1 mg/dL (ref 8.7–10.3)
Chloride: 104 mmol/L (ref 96–106)
Creatinine, Ser: 0.8 mg/dL (ref 0.57–1.00)
Globulin, Total: 2.5 g/dL (ref 1.5–4.5)
Glucose: 95 mg/dL (ref 70–99)
Potassium: 4.5 mmol/L (ref 3.5–5.2)
Sodium: 142 mmol/L (ref 134–144)
Total Protein: 7.1 g/dL (ref 6.0–8.5)
eGFR: 84 mL/min/{1.73_m2} (ref >59)

## 2021-09-19 LAB — TSH: TSH: 3.81 u[IU]/mL (ref 0.450–4.500)

## 2021-09-24 LAB — COLOGUARD: COLOGUARD: NEGATIVE

## 2021-10-07 ENCOUNTER — Ambulatory Visit
Admission: RE | Admit: 2021-10-07 | Discharge: 2021-10-07 | Disposition: A | Discharge status: Home / Self Care | Payer: Managed Care, Other (non HMO) | Source: Ambulatory Visit | Attending: Family Medicine | Admitting: Family Medicine

## 2021-10-07 DIAGNOSIS — Z1231 Encounter for screening mammogram for malignant neoplasm of breast: Secondary | ICD-10-CM

## 2021-10-07 DIAGNOSIS — Z780 Asymptomatic menopausal state: Secondary | ICD-10-CM | POA: Insufficient documentation

## 2021-10-19 ENCOUNTER — Telehealth: Payer: Self-pay | Admitting: Family Medicine

## 2021-10-19 NOTE — Telephone Encounter (Unsigned)
Copied from [place] [id number]. Topic: General - Other >> Oct 19, 2021  1:00 PM Ja-Kwan M wrote: Reason for CRM: Pt reports that her insurance sent her notification that they will not be covering her appt on 09/12/21. Pt reports that the notification stated Code# [id number] will not be covered due to it is not deemed as being medically necessary. Pt will call insurance for clarification but wanted to report this to the office.

## 2021-10-19 NOTE — Telephone Encounter (Signed)
Spoke to patient, pt verbalized her insurance is not covering her vitamin D due to no medical needs on there behalf. Under her problem list I do not see any dx that could cover it. Please advice. Patient stated if she does receive a bill she will be contacting us.

## 2021-10-20 NOTE — Telephone Encounter (Signed)
Pt will be getting back with [place].

## 2022-08-22 ENCOUNTER — Encounter: Payer: Managed Care, Other (non HMO) | Admitting: Family Medicine

## 2022-08-22 DIAGNOSIS — Z0000 Encounter for general adult medical examination without abnormal findings: Secondary | ICD-10-CM

## 2022-09-20 NOTE — Patient Instructions (Addendum)
See this link for instructions and documents that will help you complete advance care planning/advanced directives - including designating a medical power of attorney, completing a living will, etc [email or web address]  [facility] [address], [place], [place] [address] Scheduling phone #: [phone]  Health Maintenance  Topic Date Due   COVID-19 Vaccine (1) 10/07/2022*   Zoster (Shingles) Vaccine (1 of 2) 12/22/2022*   Flu Shot  10/05/2022   Mammogram  10/08/2022   DTaP/Tdap/Td vaccine (2 - Td or Tdap) 07/11/2023   Pap Smear  09/12/2024   Cologuard (Stool DNA test)  09/17/2024   Hepatitis C Screening  Completed   HIV Screening  Completed   HPV Vaccine  Aged Out  *Topic was postponed. The date shown is not the original due date.     Preventive Care 37-64 Years Old, Female Preventive care refers to lifestyle choices and visits with your health care provider that can promote health and wellness. Preventive care visits are also called wellness exams. What can I expect for my preventive care visit? Counseling Your health care provider may ask you questions about your: Medical history, including: Past medical problems. Family medical history. Pregnancy history. Current health, including: Menstrual cycle. Method of birth control. Emotional well-being. Home life and relationship well-being. Sexual activity and sexual health. Lifestyle, including: Alcohol, nicotine or tobacco, and drug use. Access to firearms. Diet, exercise, and sleep habits. Work and work Astronomer. Sunscreen use. Safety issues such as seatbelt and bike helmet use. Physical exam Your health care provider will check your: Height and weight. These may be used to calculate your BMI (body mass index). BMI is a measurement that tells if you are at a healthy weight. Waist circumference. This measures the distance  around your waistline. This measurement also tells if you are at a healthy weight and may help predict your risk of certain diseases, such as type 2 diabetes and high blood pressure. Heart rate and blood pressure. Body temperature. Skin for abnormal spots. What immunizations do I need?  Vaccines are usually given at various ages, according to a schedule. Your health care provider will recommend vaccines for you based on your age, medical history, and lifestyle or other factors, such as travel or where you work. What tests do I need? Screening Your health care provider may recommend screening tests for certain conditions. This may include: Lipid and cholesterol levels. Diabetes screening. This is done by checking your blood sugar (glucose) after you have not eaten for a while (fasting). Pelvic exam and Pap test. Hepatitis B test. Hepatitis C test. HIV (human immunodeficiency virus) test. STI (sexually transmitted infection) testing, if you are at risk. Lung cancer screening. Colorectal cancer screening. Mammogram. Talk with your health care provider about when you should start having regular mammograms. This may depend on whether you have a family history of breast cancer. BRCA-related cancer screening. This may be done if you have a family history of breast, ovarian, tubal, or peritoneal cancers. Bone density scan. This is done to screen for osteoporosis. Talk with your health care provider about your test results, treatment options, and if necessary, the need for more tests. Follow these instructions at home: Eating and drinking  Eat a diet that includes fresh fruits and vegetables, whole grains, lean protein, and low-fat dairy products. Take vitamin and mineral supplements as recommended by your health care provider. Do not drink alcohol if: Your health care provider tells you not to drink. You are pregnant,  may be pregnant, or are planning to become pregnant. If you drink  alcohol: Limit how much you have to 0-1 drink a day. Know how much alcohol is in your drink. In the [place] one drink equals one 12 oz bottle of beer (355 mL), one 5 oz glass of wine (148 mL), or one 1 oz glass of hard liquor (44 mL). Lifestyle Brush your teeth every morning and night with fluoride toothpaste. Floss one time each day. Exercise for at least 30 minutes 5 or more days each week. Do not use any products that contain nicotine or tobacco. These products include cigarettes, chewing tobacco, and vaping devices, such as e-cigarettes. If you need help quitting, ask your health care provider. Do not use drugs. If you are sexually active, practice safe sex. Use a condom or other form of protection to prevent STIs. If you do not wish to become pregnant, use a form of birth control. If you plan to become pregnant, see your health care provider for a prepregnancy visit. Take aspirin only as told by your health care provider. Make sure that you understand how much to take and what form to take. Work with your health care provider to find out whether it is safe and beneficial for you to take aspirin daily. Find healthy ways to manage stress, such as: Meditation, yoga, or listening to music. Journaling. Talking to a trusted person. Spending time with friends and family. Minimize exposure to UV radiation to reduce your risk of skin cancer. Safety Always wear your seat belt while driving or riding in a vehicle. Do not drive: If you have been drinking alcohol. Do not ride with someone who has been drinking. When you are tired or distracted. While texting. If you have been using any mind-altering substances or drugs. Wear a helmet and other protective equipment during sports activities. If you have firearms in your house, make sure you follow all gun safety procedures. Seek help if you have been physically or sexually abused. What's next? Visit your health care provider once a year for an  annual wellness visit. Ask your health care provider how often you should have your eyes and teeth checked. Stay up to date on all vaccines. This information is not intended to replace advice given to you by your health care provider. Make sure you discuss any questions you have with your health care provider. Document Revised: 08/18/2020 Document Reviewed: 08/18/2020 Elsevier Patient Education  2024 [employer].

## 2022-09-21 ENCOUNTER — Ambulatory Visit: Payer: Managed Care, Other (non HMO) | Admitting: Family Medicine

## 2022-09-21 ENCOUNTER — Encounter: Payer: Self-pay | Admitting: Family Medicine

## 2022-09-21 VITALS — BP 126/82 | HR 98 | Temp 98.0°F | Resp 16 | Ht 64.25 in | Wt 180.0 lb

## 2022-09-21 DIAGNOSIS — Z0000 Encounter for general adult medical examination without abnormal findings: Secondary | ICD-10-CM

## 2022-09-21 DIAGNOSIS — Z1231 Encounter for screening mammogram for malignant neoplasm of breast: Secondary | ICD-10-CM

## 2022-09-21 NOTE — Progress Notes (Signed)
Patient: Katelyn Ochoa, Female    DOB: [birth year], 62 y.o.   MRN: [id number] Danelle Berry, PA-C Visit Date: 09/21/2022  Today's Provider: Danelle Berry, PA-C   Chief Complaint  Patient presents with   Annual Exam   Subjective:   Annual physical exam:  Katelyn Ochoa is a 62 y.o. female who presents today for complete physical exam:  Exercise/Activity:   running  Diet/nutrition:  eats mostly at home, pushes a lot of fluids Sleep:   no concerns  SDOH Screenings   Food Insecurity: No Food Insecurity (09/21/2022)  Housing: Low Risk  (09/21/2022)  Transportation Needs: No Transportation Needs (09/21/2022)  Utilities: Not At Risk (09/21/2022)  Alcohol Screen: Low Risk  (09/21/2022)  Depression (PHQ2-9): Low Risk  (09/21/2022)  Financial Resource Strain: Low Risk  (09/21/2022)  Physical Activity: Sufficiently Active (09/21/2022)  Social Connections: Socially Integrated (09/21/2022)  Stress: Stress Concern Present (09/21/2022)  Tobacco Use: Low Risk  (09/21/2022)  Health Literacy: Adequate Health Literacy (09/21/2022)    USPSTF grade A and B recommendations - reviewed and addressed today  Depression:  Phq 9 completed today by patient, was reviewed by me with patient in the room PHQ score is neg, pt feels good overall    09/21/2022    8:33 AM 09/12/2021    3:20 PM 09/09/2020    2:53 PM 08/29/2019    2:19 PM  PHQ 2/9 Scores  PHQ - 2 Score 0 0 6 0  PHQ- 9 Score 0 0 6 0      09/21/2022    8:33 AM 09/12/2021    3:20 PM 09/09/2020    2:53 PM 08/29/2019    2:19 PM 07/30/2018    3:27 PM  Depression screen PHQ 2/9  Decreased Interest 0 0 3 0 0  Down, Depressed, Hopeless 0 0 3 0 0  PHQ - 2 Score 0 0 6 0 0  Altered sleeping 0 0 0 0 0  Tired, decreased energy 0 0 0 0 0  Change in appetite 0 0 0 0 0  Feeling bad or failure about yourself  0 0 0 0 0  Trouble concentrating 0 0 0 0 0  Moving slowly or fidgety/restless 0 0 0 0 0  Suicidal thoughts 0 0 0 0 0  PHQ-9 Score 0 0 6 0 0  Difficult  doing work/chores Not difficult at all Not difficult at all Not difficult at all Not difficult at all Not difficult at all    Alcohol screening: Flowsheet Row Office Visit from 09/21/2022 in [facility]  AUDIT-C Score 0       Immunizations and Health Maintenance: Health Maintenance  Topic Date Due   COVID-19 Vaccine (1) 10/07/2022 (Originally 04/08/1965)   Zoster Vaccines- Shingrix (1 of 2) 12/22/2022 (Originally 04/09/1979)   INFLUENZA VACCINE  10/05/2022   MAMMOGRAM  10/08/2022   DTaP/Tdap/Td (2 - Td or Tdap) 07/11/2023   PAP SMEAR-Modifier  09/12/2024   Fecal DNA (Cologuard)  09/17/2024   Hepatitis C Screening  Completed   HIV Screening  Completed   HPV VACCINES  Aged Out     Hep C Screening: done  STD testing and prevention (HIV/chl/gon/syphilis):  see above, no additional testing desired by pt today  Intimate partner violence:  safe  Sexual History/Pain during Intercourse: Married, not very sexually active no sx   Menstrual History/LMP/Abnormal Bleeding:   No LMP recorded. Patient is postmenopausal.  Incontinence Symptoms:  none  Breast cancer: doing annually  Last Mammogram: *see HM list above  Cervical cancer screening: UTD, due in 2026  Osteoporosis:  did last year osteopenia  Discussion on osteoporosis per age, including high calcium and vitamin D supplementation, weight bearing exercises   Skin cancer:  Hx of skin CA -  NO Discussed atypical lesions   Colorectal cancer:  due in 2026 Colonoscopy is UTD   Discussed concerning signs and sx of CRC, pt denies melena, hematochezia, change in BM pattern or caliber   Lung cancer:   Low Dose CT Chest recommended if Age 38-80 years, 20 pack-year currently smoking OR have quit w/in 15years. Patient does not qualify.    Social History   Tobacco Use   Smoking status: Never   Smokeless tobacco: Never  Vaping Use   Vaping status: Never Used  Substance Use Topics   Alcohol use: Never    Drug use: No     Flowsheet Row Office Visit from 09/21/2022 in [facility]  AUDIT-C Score 0       Family History  Problem Relation Age of Onset   Hyperlipidemia Mother    Hearing loss Mother    Heart disease Father    COPD Father    Hypertension Father    Breast cancer Daughter 66   Cancer Daughter        breast   Breast cancer Maternal Aunt    Breast cancer Maternal Grandmother    Colon cancer Neg Hx    Ovarian cancer Neg Hx      Blood pressure/Hypertension: BP Readings from Last 3 Encounters:  09/21/22 126/82  09/12/21 130/74  01/09/21 (!) 150/90    Weight/Obesity: Wt Readings from Last 3 Encounters:  09/21/22 180 lb (81.6 kg)  09/12/21 171 lb 3.2 oz (77.7 kg)  01/09/21 170 lb (77.1 kg)   BMI Readings from Last 3 Encounters:  09/21/22 30.66 kg/m  09/12/21 29.39 kg/m  01/09/21 28.29 kg/m     Lipids:  Lab Results  Component Value Date   CHOL 237 (H) 09/13/2021   CHOL 246 (H) 09/09/2020   CHOL 225 (H) 09/05/2019   Lab Results  Component Value Date   HDL 73 09/13/2021   HDL 82 09/09/2020   HDL 70 09/05/2019   Lab Results  Component Value Date   LDLCALC 153 (H) 09/13/2021   LDLCALC 153 (H) 09/09/2020   LDLCALC 138 (H) 09/05/2019   Lab Results  Component Value Date   TRIG 68 09/13/2021   TRIG 67 09/09/2020   TRIG 97 09/05/2019   Lab Results  Component Value Date   CHOLHDL 3.2 09/13/2021   CHOLHDL 3.2 09/05/2019   CHOLHDL 2.9 08/26/2018   No results found for: "LDLDIRECT" Based on the results of lipid panel his/her cardiovascular risk factor ( using Poole Cohort )  in the next 10 years is: The 10-year ASCVD risk score (Arnett DK, et al., 2019) is: 3.8%   Values used to calculate the score:     Age: 41 years     Sex: Female     Is Non-Hispanic African American: No     Diabetic: No     Tobacco smoker: No     Systolic Blood Pressure: 126 mmHg     Is BP treated: No     HDL Cholesterol: 73 mg/dL     Total  Cholesterol: 237 mg/dL  Glucose:  Glucose  Date Value Ref Range Status  09/13/2021 95 70 - 99 mg/dL Final  16/12/9602 88 65 - 99  mg/dL Final  60/45/4098 79 65 - 99 mg/dL Final   Glucose, Bld  Date Value Ref Range Status  01/09/2021 106 (H) 70 - 99 mg/dL Final    Comment:    Glucose reference range applies only to samples taken after fasting for at least 8 hours.  07/25/2016 91 65 - 99 mg/dL Final    Advanced Care Planning:  A voluntary discussion about advance care planning including the explanation and discussion of advance directives.   Discussed health care proxy and Living will, and the patient was able to identify a health care proxy as spouse.   Patient does not have a living will at present time.   Social History       Social History   Socioeconomic History   Marital status: Married    Spouse name: Deniece Portela   Number of children: 3   Years of education: 12   Highest education level: High school graduate  Occupational History   Not on file  Tobacco Use   Smoking status: Never   Smokeless tobacco: Never  Vaping Use   Vaping status: Never Used  Substance and Sexual Activity   Alcohol use: Never   Drug use: No   Sexual activity: Not Currently    Birth control/protection: Post-menopausal  Other Topics Concern   Not on file  Social History Narrative   occassional alcohol/over week ends; never smoked; in [place]; in [facility].    Social Determinants of Health   Financial Resource Strain: Low Risk  (09/21/2022)   Overall Financial Resource Strain (CARDIA)    Difficulty of Paying Living Expenses: Not hard at all  Food Insecurity: No Food Insecurity (09/21/2022)   Hunger Vital Sign    Worried About Running Out of Food in the Last Year: Never true    Ran Out of Food in the Last Year: Never true  Transportation Needs: No Transportation Needs (09/21/2022)   PRAPARE - Administrator, Civil Service (Medical): No    Lack of Transportation (Non-Medical): No   Physical Activity: Sufficiently Active (09/21/2022)   Exercise Vital Sign    Days of Exercise per Week: 7 days    Minutes of Exercise per Session: 30 min  Stress: Stress Concern Present (09/21/2022)   [employer] of Occupational Health - Occupational Stress Questionnaire    Feeling of Stress : To some extent  Social Connections: Socially Integrated (09/21/2022)   Social Connection and Isolation Panel [NHANES]    Frequency of Communication with Friends and Family: More than three times a week    Frequency of Social Gatherings with Friends and Family: More than three times a week    Attends Religious Services: More than 4 times per year    Active Member of [employer] or Organizations: Yes    Attends Engineer, structural: More than 4 times per year    Marital Status: Married    Family History        Family History  Problem Relation Age of Onset   Hyperlipidemia Mother    Hearing loss Mother    Heart disease Father    COPD Father    Hypertension Father    Breast cancer Daughter 16   Cancer Daughter        breast   Breast cancer Maternal Aunt    Breast cancer Maternal Grandmother    Colon cancer Neg Hx    Ovarian cancer Neg Hx     Patient Active Problem List   Diagnosis Date Noted  Thrombocytopenia (HCC) 09/02/2018   Elevated hemoglobin (HCC) 07/26/2016   Colon cancer screening 07/22/2015   Preventative health care 07/22/2015   Neoplasm of uncertain behavior of skin of lower leg 07/22/2015   Pap smear for cervical cancer screening 07/22/2015    History reviewed. No pertinent surgical history.   Current Outpatient Medications:    Multiple Vitamin (MULTIVITAMIN) tablet, Take 1 tablet by mouth daily., Disp: , Rfl:    Vit B6-Vit B12-Omega 3 Acids (VITAMIN B PLUS+ PO), Take 1 tablet by mouth. , Disp: , Rfl:   Allergies  Allergen Reactions   Penicillins Rash    Patient Care Team: Danelle Berry, PA-C as PCP - General (Family Medicine)   Chart Review: I  personally reviewed active problem list, medication list, allergies, family history, social history, health maintenance, notes from last encounter, lab results, imaging with the patient/caregiver today.   Review of Systems  Constitutional: Negative.   HENT: Negative.    Eyes: Negative.   Respiratory: Negative.    Cardiovascular: Negative.   Gastrointestinal: Negative.   Endocrine: Negative.   Genitourinary: Negative.   Musculoskeletal: Negative.   Skin: Negative.   Allergic/Immunologic: Negative.   Neurological: Negative.   Hematological: Negative.   Psychiatric/Behavioral: Negative.    All other systems reviewed and are negative.         Objective:   Vitals:  Vitals:   09/21/22 0841  BP: 126/82  Pulse: 98  Resp: 16  Temp: 98 F (36.7 C)  TempSrc: Oral  SpO2: 95%  Weight: 180 lb (81.6 kg)  Height: 5' 4.25" (1.632 m)    Body mass index is 30.66 kg/m.  Physical Exam Vitals and nursing note reviewed.  Constitutional:      General: She is not in acute distress.    Appearance: Normal appearance. She is well-developed and well-groomed. She is not ill-appearing, toxic-appearing or diaphoretic.  HENT:     Head: Normocephalic and atraumatic.     Right Ear: External ear normal.     Left Ear: External ear normal.     Nose: Nose normal.     Mouth/Throat:     Mouth: Mucous membranes are moist.     Pharynx: Uvula midline.  Eyes:     General: Lids are normal. No scleral icterus.       Right eye: No discharge.        Left eye: No discharge.     Conjunctiva/sclera: Conjunctivae normal.  Neck:     Trachea: Phonation normal. No tracheal deviation.  Cardiovascular:     Rate and Rhythm: Normal rate and regular rhythm.     Pulses: Normal pulses.          Radial pulses are 2+ on the right side and 2+ on the left side.       Posterior tibial pulses are 2+ on the right side and 2+ on the left side.     Heart sounds: Normal heart sounds. No murmur heard.    No friction rub. No  gallop.  Pulmonary:     Effort: Pulmonary effort is normal. No respiratory distress.     Breath sounds: Normal breath sounds. No stridor. No wheezing, rhonchi or rales.  Chest:     Chest wall: No tenderness.  Abdominal:     General: Bowel sounds are normal. There is no distension.     Palpations: Abdomen is soft.     Tenderness: There is no abdominal tenderness. There is no guarding or rebound.  Musculoskeletal:  General: No deformity.     Cervical back: Normal range of motion and neck supple.  Lymphadenopathy:     Cervical: No cervical adenopathy.  Skin:    General: Skin is warm and dry.     Capillary Refill: Capillary refill takes less than 2 seconds.     Coloration: Skin is not pale.     Findings: No rash.  Neurological:     Mental Status: She is alert. Mental status is at baseline.     Motor: No abnormal muscle tone.     Coordination: Coordination normal.     Gait: Gait normal.  Psychiatric:        Speech: Speech normal.        Behavior: Behavior normal. Behavior is cooperative.       Fall Risk:    09/21/2022    8:32 AM 09/12/2021    3:20 PM 09/09/2020    2:45 PM 08/29/2019    2:19 PM 07/30/2018    3:27 PM  Fall Risk   Falls in the past year? 0 0 0 0 0  Number falls in past yr: 0 0 0 0   Injury with Fall? 0 0 0 0   Risk for fall due to : No Fall Risks No Fall Risks     Follow up Falls prevention discussed;Education provided;Falls evaluation completed Falls prevention discussed;Education provided Falls evaluation completed      Functional Status Survey: Is the patient deaf or have difficulty hearing?: No Does the patient have difficulty seeing, even when wearing glasses/contacts?: No Does the patient have difficulty concentrating, remembering, or making decisions?: No Does the patient have difficulty walking or climbing stairs?: No Does the patient have difficulty dressing or bathing?: No Does the patient have difficulty doing errands alone such as visiting a  doctor's office or shopping?: No   Assessment & Plan:    CPE completed today  USPSTF grade A and B recommendations reviewed with patient; age-appropriate recommendations, preventive care, screening tests, etc discussed and encouraged; healthy living encouraged; see AVS for patient education given to patient  Discussed importance of 150 minutes of physical activity weekly, AHA exercise recommendations given to pt in AVS/handout  Discussed importance of healthy diet:  eating lean meats and proteins, avoiding trans fats and saturated fats, avoid simple sugars and excessive carbs in diet, eat 6 servings of fruit/vegetables daily and drink plenty of water and avoid sweet beverages.    Recommended pt to do annual eye exam and routine dental exams/cleanings  Depression, alcohol, fall screening completed as documented above and per flowsheets  Advance Care planning information and packet discussed and offered today, encouraged pt to discuss with family members/spouse/partner/friends and complete Advanced directive packet and bring copy to office   Reviewed Health Maintenance: Health Maintenance  Topic Date Due   COVID-19 Vaccine (1) 10/07/2022 (Originally 04/08/1965)   Zoster Vaccines- Shingrix (1 of 2) 12/22/2022 (Originally 04/09/1979)   INFLUENZA VACCINE  10/05/2022   MAMMOGRAM  10/08/2022   DTaP/Tdap/Td (2 - Td or Tdap) 07/11/2023   PAP SMEAR-Modifier  09/12/2024   Fecal DNA (Cologuard)  09/17/2024   Hepatitis C Screening  Completed   HIV Screening  Completed   HPV VACCINES  Aged Out    Immunizations: Immunization History  Administered Date(s) Administered   MMR 08/27/2017   Tdap 07/10/2013   Vaccines:  HPV: up to at age 61 , ask insurance if age between 62-45  Shingrix: 45-64 yo and ask insurance if covered when patient above 63  yo Pneumonia: n/a educated and discussed with patient. Flu: annual-  educated and discussed with patient.    ICD-10-CM   1. Annual physical exam   Z00.00 CBC with Differential/Platelet    Lipid panel    Comprehensive metabolic panel    Hemoglobin A1c    2. Encounter for screening mammogram for malignant neoplasm of breast  Z12.31 MM 3D SCREENING MAMMOGRAM BILATERAL BREAST          Danelle Berry, PA-C 09/21/22 8:58 AM  [facility]

## 2022-09-22 LAB — LIPID PANEL
Chol/HDL Ratio: 2.9 ratio (ref 0.0–4.4)
HDL: 81 mg/dL (ref >39)
Triglycerides: 84 mg/dL (ref 0–149)
VLDL Cholesterol Cal: 14 mg/dL (ref 5–40)

## 2022-09-22 LAB — CBC WITH DIFFERENTIAL/PLATELET

## 2022-09-22 LAB — COMPREHENSIVE METABOLIC PANEL
ALT: 23 IU/L (ref 0–32)
AST: 22 IU/L (ref 0–40)
Albumin: 4.8 g/dL (ref 3.9–4.9)
BUN/Creatinine Ratio: 20 (ref 12–28)
BUN: 16 mg/dL (ref 8–27)
Bilirubin Total: 0.5 mg/dL (ref 0.0–1.2)
Calcium: 10.4 mg/dL — ABNORMAL HIGH (ref 8.7–10.3)
Chloride: 104 mmol/L (ref 96–106)
Globulin, Total: 2.3 g/dL (ref 1.5–4.5)
Potassium: 4.4 mmol/L (ref 3.5–5.2)
Sodium: 141 mmol/L (ref 134–144)
eGFR: 83 mL/min/{1.73_m2} (ref >59)

## 2022-09-22 LAB — HEMOGLOBIN A1C
Est. average glucose Bld gHb Est-mCnc: 123 mg/dL
Hgb A1c MFr Bld: 5.9 % — ABNORMAL HIGH (ref 4.8–5.6)

## 2022-09-23 LAB — LIPID PANEL
Cholesterol, Total: 236 mg/dL — ABNORMAL HIGH (ref 100–199)
LDL Chol Calc (NIH): 141 mg/dL — ABNORMAL HIGH (ref 0–99)

## 2022-09-23 LAB — CBC WITH DIFFERENTIAL/PLATELET
Basos: 1 %
Eos: 3 %
Immature Grans (Abs): 0 10*3/uL (ref 0.0–0.1)
Immature Granulocytes: 0 %
Lymphs: 29 %
MCHC: 33.3 g/dL (ref 31.5–35.7)
Monocytes Absolute: 0.5 10*3/uL (ref 0.1–0.9)
Monocytes: 7 %
Neutrophils: 60 %
RBC: 5.52 x10E6/uL — ABNORMAL HIGH (ref 3.77–5.28)
RDW: 12.6 % (ref 11.7–15.4)

## 2022-09-23 LAB — COMPREHENSIVE METABOLIC PANEL
Alkaline Phosphatase: 114 IU/L (ref 44–121)
CO2: 21 mmol/L (ref 20–29)
Creatinine, Ser: 0.8 mg/dL (ref 0.57–1.00)
Glucose: 90 mg/dL (ref 70–99)
Total Protein: 7.1 g/dL (ref 6.0–8.5)

## 2022-09-29 ENCOUNTER — Encounter: Payer: Self-pay | Admitting: Family Medicine

## 2022-09-29 ENCOUNTER — Telehealth: Payer: Self-pay | Admitting: Family Medicine

## 2022-09-29 NOTE — Telephone Encounter (Signed)
Pt notified sent

## 2022-09-29 NOTE — Telephone Encounter (Signed)
Copied from CRM [id number]. Topic: General - Other >> Sep 29, 2022  9:42 AM Phill Myron wrote: Please call Ms Loye...  and let her know  the status of her form she left.  Pt states that she was seen by her PCP on 09/21/2022 and left a form to be filled out once her blood work came back and it was supposed to be faxed back for  insurance purposes. Patient stated there is a deadline for this document. Please call and advise patient. Thank you,

## 2022-10-09 ENCOUNTER — Ambulatory Visit
Admission: RE | Admit: 2022-10-09 | Discharge: 2022-10-09 | Disposition: A | Discharge status: Home / Self Care | Payer: Managed Care, Other (non HMO) | Source: Ambulatory Visit | Attending: Family Medicine | Admitting: Family Medicine

## 2022-10-09 DIAGNOSIS — Z1231 Encounter for screening mammogram for malignant neoplasm of breast: Secondary | ICD-10-CM | POA: Diagnosis present

## 2023-01-03 ENCOUNTER — Other Ambulatory Visit: Payer: Self-pay

## 2023-01-03 DIAGNOSIS — Y92481 Parking lot as the place of occurrence of the external cause: Secondary | ICD-10-CM | POA: Diagnosis not present

## 2023-01-03 DIAGNOSIS — S7002XA Contusion of left hip, initial encounter: Secondary | ICD-10-CM | POA: Insufficient documentation

## 2023-01-03 DIAGNOSIS — S0990XA Unspecified injury of head, initial encounter: Secondary | ICD-10-CM | POA: Insufficient documentation
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
## 2023-01-04 ENCOUNTER — Emergency Department
Admission: EM | Admit: 2023-01-04 | Discharge: 2023-01-04 | Disposition: A | Discharge status: Home / Self Care | Payer: Managed Care, Other (non HMO) | Attending: Emergency Medicine | Admitting: Emergency Medicine

## 2023-01-04 ENCOUNTER — Emergency Department: Payer: Managed Care, Other (non HMO)

## 2023-01-04 DIAGNOSIS — S0990XA Unspecified injury of head, initial encounter: Secondary | ICD-10-CM

## 2023-01-04 DIAGNOSIS — S7002XA Contusion of left hip, initial encounter: Secondary | ICD-10-CM

## 2023-01-04 NOTE — Discharge Instructions (Signed)
Take acetaminophen 650 mg and ibuprofen 400 mg every 6 hours for pain.  Take with food.  Thank you for choosing us for your health care today!  Please see your primary doctor this week for a follow up appointment.   If you have any new, worsening, or unexpected symptoms call your doctor right away or come back to the emergency department for reevaluation.  It was my pleasure to care for you today.   Yarrow Linhart S. Kealan Buchan, MD  

## 2023-01-04 NOTE — ED Notes (Signed)
Pt ambulated to bathroom and back
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
